# Patient Record
Sex: Male | Born: 1955
Health system: Southern US, Community
[De-identification: ages and names within clinical notes are randomized; demographics above are authoritative.]

## PROBLEM LIST (undated history)

## (undated) DIAGNOSIS — I10 Essential (primary) hypertension: Secondary | ICD-10-CM

## (undated) DIAGNOSIS — E7800 Pure hypercholesterolemia, unspecified: Secondary | ICD-10-CM

## (undated) DIAGNOSIS — C801 Malignant (primary) neoplasm, unspecified: Secondary | ICD-10-CM

## (undated) DIAGNOSIS — G4730 Sleep apnea, unspecified: Secondary | ICD-10-CM

## (undated) HISTORY — PX: HERNIA REPAIR: SHX51

---

## 2002-11-25 ENCOUNTER — Ambulatory Visit (HOSPITAL_COMMUNITY): Admission: RE | Admit: 2002-11-25 | Discharge: 2002-11-25 | Payer: Self-pay | Admitting: General Surgery

## 2003-12-06 HISTORY — PX: APPENDECTOMY: SHX54

## 2005-05-11 ENCOUNTER — Inpatient Hospital Stay (HOSPITAL_COMMUNITY): Admission: EM | Admit: 2005-05-11 | Discharge: 2005-05-17 | Payer: Self-pay | Admitting: Emergency Medicine

## 2006-03-05 ENCOUNTER — Emergency Department (HOSPITAL_COMMUNITY): Admission: EM | Admit: 2006-03-05 | Discharge: 2006-03-05 | Payer: Self-pay | Admitting: Emergency Medicine

## 2006-04-04 ENCOUNTER — Emergency Department (HOSPITAL_COMMUNITY): Admission: EM | Admit: 2006-04-04 | Discharge: 2006-04-04 | Payer: Self-pay | Admitting: Emergency Medicine

## 2006-04-12 ENCOUNTER — Ambulatory Visit (HOSPITAL_COMMUNITY): Admission: RE | Admit: 2006-04-12 | Discharge: 2006-04-12 | Payer: Self-pay | Admitting: Family Medicine

## 2006-07-05 HISTORY — PX: CARDIAC CATHETERIZATION: SHX172

## 2006-07-30 ENCOUNTER — Inpatient Hospital Stay (HOSPITAL_COMMUNITY): Admission: AD | Admit: 2006-07-30 | Discharge: 2006-08-01 | Payer: Self-pay | Admitting: Internal Medicine

## 2006-07-30 ENCOUNTER — Ambulatory Visit: Payer: Self-pay | Admitting: Internal Medicine

## 2006-07-30 ENCOUNTER — Encounter: Payer: Self-pay | Admitting: Emergency Medicine

## 2006-08-31 ENCOUNTER — Ambulatory Visit: Payer: Self-pay

## 2006-08-31 ENCOUNTER — Ambulatory Visit: Payer: Self-pay | Admitting: Cardiology

## 2006-09-05 ENCOUNTER — Ambulatory Visit (HOSPITAL_COMMUNITY): Admission: RE | Admit: 2006-09-05 | Discharge: 2006-09-05 | Payer: Self-pay | Admitting: Internal Medicine

## 2006-09-27 ENCOUNTER — Ambulatory Visit: Payer: Self-pay | Admitting: Internal Medicine

## 2007-03-11 ENCOUNTER — Emergency Department (HOSPITAL_COMMUNITY): Admission: EM | Admit: 2007-03-11 | Discharge: 2007-03-11 | Payer: Self-pay | Admitting: Emergency Medicine

## 2007-04-29 ENCOUNTER — Emergency Department (HOSPITAL_COMMUNITY): Admission: EM | Admit: 2007-04-29 | Discharge: 2007-04-29 | Payer: Self-pay | Admitting: Emergency Medicine

## 2007-12-26 ENCOUNTER — Emergency Department (HOSPITAL_COMMUNITY): Admission: EM | Admit: 2007-12-26 | Discharge: 2007-12-26 | Payer: Self-pay | Admitting: Emergency Medicine

## 2007-12-29 ENCOUNTER — Emergency Department (HOSPITAL_COMMUNITY): Admission: EM | Admit: 2007-12-29 | Discharge: 2007-12-29 | Payer: Self-pay | Admitting: Emergency Medicine

## 2008-04-12 ENCOUNTER — Emergency Department (HOSPITAL_COMMUNITY): Admission: EM | Admit: 2008-04-12 | Discharge: 2008-04-12 | Payer: Self-pay | Admitting: Emergency Medicine

## 2010-02-05 ENCOUNTER — Ambulatory Visit (HOSPITAL_COMMUNITY): Admission: RE | Admit: 2010-02-05 | Discharge: 2010-02-05 | Payer: Self-pay | Admitting: Family Medicine

## 2011-04-22 NOTE — Op Note (Signed)
   NAME:  Corey Carson, Corey Carson                         ACCOUNT NO.:  [id number]   MEDICAL RECORD NO.:  [id number]                   PATIENT TYPE:  AMB   LOCATION:  DAY                                  FACILITY:  [facility]   PHYSICIAN:  Dalia Heading, M.D.               DATE OF BIRTH:  09-30-1956   DATE OF PROCEDURE:  11/25/2002  DATE OF DISCHARGE:                                 OPERATIVE REPORT   PREOPERATIVE DIAGNOSES:  Umbilical hernia.   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   PROCEDURE:  Umbilical herniorrhaphy, ventral herniorrhaphy.   SURGEON:  Dalia Heading, M.D.   ANESTHESIA:  General.   INDICATIONS FOR PROCEDURE:  The patient is a 55 year old black male who is  referred for evaluation and treatment of an umbilical hernia. The risks and  benefits of the procedure including bleeding, infection, and recurrence of  the hernia were fully explained to the patient, who gave informed consent.   DESCRIPTION OF PROCEDURE:  The patient was placed in the supine position.  After general anesthesia was administered, the abdomen was prepped and  draped using the usual sterile technique with Betadine. Surgical site  confirmation was performed.   An infraumbilical incision was made down to the fascia. The umbilicus was  freed away from the underlying umbilical hernia sac. It was also noted the  patient had a epiplocele just superior to the umbilical hernia. There was  preperitoneal fat emanating from the epiplocele. This adipose tissue was  excised and the epiplocele closed using #0 Surgidek interrupted sutures. The  hernia sac was excised from the umbilicus and the hernia repaired using #0  Surgidek interrupted sutures. The base of the umbilicus was secured to the  fascia using a 2-0 Vicryl interrupted suture. The subcutaneous layer was  reapproximated using a 3-0 Vicryl interrupted suture. The skin was closed  using staples. 0.5%  Sensorcaine was instilled into the surrounding wound.  The wound was closed with staples.   All tape and needle counts were correct at the end of the procedure. The  patient was awakened and transferred to PACU in stable condition.   COMPLICATIONS:  None.    SPECIMENS:  Umbilical hernia sac.   ESTIMATED BLOOD LOSS:  Minimal.                                               Dalia Heading, M.D.    MAJ/MEDQ  D:  11/25/2002  T:  11/25/2002  Job:  [id number]   cc:   Corrie Mckusick, M.D.  [address]ure[address] Josephs. A  [place]  [place] [address]  Fax: [phone]

## 2011-04-22 NOTE — Cardiovascular Report (Signed)
NAMEBENNY, Corey Carson               ACCOUNT NO.:  [id number]   MEDICAL RECORD NO.:  [id number]          PATIENT TYPE:  INP   LOCATION:  2002                         FACILITY:  [facility]   PHYSICIAN:  Noralyn Pick. Nishan, MD,FACCDATE OF BIRTH:  July 31, 1956   DATE OF PROCEDURE:  DATE OF DISCHARGE:                              CARDIAC CATHETERIZATION   CORONARY ARTERIOGRAPHY:   INDICATIONS:  Chest pain, PVC.   Catheterization was done from right femoral artery, using 6 French  catheters.  At the end of the case, right iliac angiography was performed.  We were in good position to Angio-Seal, which we deployed with good  hemostasis.  The patient had a mild vagal reaction after closure device,  secondary to the pain.   This was just treated with a little bit of fluid and no atropine was  necessary.   Left main coronary artery was normal.   Left anterior descending artery was normal.   First and second diagonal branch was normal.   Circumflex coronary artery was nondominant and normal.   The right coronary artery was somewhat difficult to cannulate, had a high  anterior take-off near the right and left coronary commissure.  We were able  to inject it using an AL1 catheter.  If the patient has future  catheterization, I suspect an AL2 would selectively cannulate it.   The right coronary artery was dominant and normal.   RIGHT VENTRICULOGRAPHY:  Right ventriculography showed mild global  hypokinesis with an EF of 50-55%.  There were no focal regional wall motion  abnormalities.  There was no MR.   Aortic pressure was 149/94, LV pressure was 150/12.   IMPRESSION:  The patient does not have significant coronary disease.  His  chest pain would appear to be noncardiac in etiology.  He does appear to  have hypertension and PVC.   We will start him on lisinopril 10 mg a day.   So long as his groin heals well, I suspect he will be able to go home  tomorrow.     ______________________________  Noralyn Pick Eden Emms, MD,FACC     PCN/MEDQ  D:  07/31/2006  T:  08/01/2006  Job:  [id number]

## 2011-04-22 NOTE — Assessment & Plan Note (Signed)
[facility]                           ELECTROPHYSIOLOGY OFFICE NOTE   ZEVIN, NEVARES                      MRN:          [id number]  DATE:09/27/2006                            DOB:          1956-11-26    Corey Carson returns today for follow-up.  He is a very pleasant middle-aged  man with a history of hypertension who was admitted to the hospital with  chest pain and syncope.  Although he had a good story for coronary disease,  he was subsequently found to have normal LV function and no obstructive  coronary disease on catheterization.  He was discharged home and returns  today for follow-up.  The patient has been on lisinopril for hypertension  and Prilosec OTC for gastroesophageal reflux disease.  He has had no syncope  and overall has done well since discharge from the hospital.  He does note  that he does not particularly like taking his blood pressure medications but  realizes that he must do so.   PHYSICAL EXAMINATION:  GENERAL APPEARANCE:  He is a pleasant middle-aged man  in no acute distress.  VITAL SIGNS:  Blood pressure 140/90, pulse 70 and regular, respirations 18,  weight 244 pounds.  NECK:  No jugular venous distension, no thyromegaly.  Trachea midline.  Carotids 2+ and symmetric.  LUNGS:  Clear to auscultation bilaterally.  No wheezing, rhonchi or rales.  CARDIOVASCULAR:  Regular rate and rhythm with normal S1 and S2.  There was a  soft S4 gallop present.  EXTREMITIES:  No clubbing, cyanosis, or edema.   IMPRESSION:  1. Atypical chest pain.  2. Syncope.  3. Right bundle branch block.   DISCUSSION:  Overall, Mr. Dehner is stable.  I have cautioned him of the  importance of taking his antihypertensive medications.  In addition, if he  passes out again, we would consider 30-day monitor or implantable loop  recorder.  We will see him back in one year, sooner should he have recurrent  syncope.     ______________________________  Doylene Canning. Ladona Ridgel, MD    GWT/MedQ  DD:  09/27/2006  DT:  09/28/2006  Job #:  [id number]   cc:   Madelin Rear. Sherwood Gambler, MD

## 2011-04-22 NOTE — Consult Note (Signed)
NAMESUKHRAJ, ESQUIVIAS NO.:  [id number]   MEDICAL RECORD NO.:  [id number]          PATIENT TYPE:  EMS   LOCATION:  ED                            FACILITY:  [facility]   PHYSICIAN:  Barbaraann Barthel, M.D. DATE OF BIRTH:  1956/03/18   DATE OF CONSULTATION:  DATE OF DISCHARGE:                                   CONSULTATION   Surgery was asked to see this 55 year old black male for appendicitis.   CHIEF COMPLAINT:  Lower abdominal pain and anorexia.   HISTORY OF PRESENT ILLNESS:  The patient states that he has had vague  abdominal pain for about possibly 4 weeks.  He attributed this to a stomach  virus and did not self-medicate himself with anything particularly, and when  the pain worsened today and he was doubled over with discomfort, he came to  the emergency room.  A CT scan was performed and revealed appendicitis  without perforation.  Surgery was consulted and responded immediately.   PHYSICAL EXAMINATION:  GENERAL: A 55 year old black male, uncomfortable, but  in no acute distress.  He is approximately 5 feet 10 inches and weighs 250  pounds.  His temperature is 98.5, pulse rate 66, respirations 18 per minute,  blood pressure 118/64.  HEENT: Head is normocephalic.  Eyes: Extraocular movements intact.  Pupils  are round and react to light and accommodation.  There is no conjunctival  pallor or scleral injection.  Sclerae is of normal tincture.  Nose and oral  mucosa are moist.  NECK: Supple and cylindrical without jugular venous distention, thyromegaly,  tracheal deviation, or cervical adenopathy.  No bruits are auscultated.  CHEST: Clear, both anterior and posterior, to auscultation.  HEART: Regular rhythm.  ABDOMEN: There is guarding in the right lower quadrant.  No obvious rebound.  Bowel sounds are present.  The patient has had a previous herniorrhaphy;  however, I do not see any scars from this and he does not recall which side  he was operated on.  RECTAL:  Prostate is smooth.  Stool is guaiac negative.  EXTREMITIES: Within normal limits.  Pulses are symmetrical.  No cyanosis,  clubbing, or varicosities.   REVIEW OF SYSTEMS:  GI SYSTEM: No past history of hepatitis.  The patient  has had some mild nausea and anorexia beginning last night and vague  abdominal pain for 3-4 weeks.  He reports no change in his bowel habits.  No  bright red rectal bleeding, black tarry stools, or history of inflammatory  bowel disease or unexplained weight loss.  GU SYSTEM: No history of  nephrolithiasis or dysuria.  ENDOCRINE: No history of diabetes or thyroid  disease.  CARDIORESPIRATORY SYSTEM: The patient is a nondrinker, nonsmoker.  The patient does have a history of asthma for which he takes an inhaler on a  p.r.n. basis.   SOCIOECONOMIC HISTORY:  The patient works for [employer].   MEDICATIONS:  The patient is to take no medications other than an inhaler on  a p.r.n. basis.   ALLERGIES:  He is allergic to SULFA.   PAST SURGICAL HISTORY:  Herniorrhaphy, unknown which  side, and I cannot tell  clinically, and his mother states he has also had a tonsillectomy.   LABORATORY DATA:  His CT scan shows acute appendicitis without abscess  formation; however, I note considerable periappendiceal inflammation and  there is some small amount of fluid in the pelvis, and I suspect that he  might have an abscess clinically.  His white count is 13.4 with an H&H of  13.1 and 38.4 with 73 neutrophils noted and 330,000 platelets.  His  electrolytes are all grossly within normal limits.  His BUN is 10 and his  creatinine is 0.8.  Liver function studies are within normal limits.   IMPRESSION:  1.  Acute appendicitis, I suspect abscess formation clinically.  2.  History of asthma for which he takes an inhaler.   PLAN:  We will plan for an appendectomy as soon as possible.  This will  likely be an open appendectomy as I suspect that this will require that  there will  be considerable inflammation there.  We discussed complications  not limited to, but including bleeding, infection, and appendiceal stump  leaking, and the possibility of the need for a drain and packing wound open.  Informed consent was obtained.  I discussed this in detail with both his  mother and father and consent was obtained and witnessed.       WB/MEDQ  D:  05/11/2005  T:  05/11/2005  Job:  [id number]   cc:   Dr. __________  Emergency Room

## 2011-04-22 NOTE — Op Note (Signed)
NAMEYERIK, ZERINGUE NO.:  [id number]   MEDICAL RECORD NO.:  [id number]          PATIENT TYPE:  EMS   LOCATION:  ED                            FACILITY:  [facility]   PHYSICIAN:  Barbaraann Barthel, M.D. DATE OF BIRTH:  1956-04-18   DATE OF PROCEDURE:  05/11/2005  DATE OF DISCHARGE:                                 OPERATIVE REPORT   SURGEON:  Dr. Malvin Johns.   PREOPERATIVE DIAGNOSIS:  Acute appendicitis with impression of possible  abscess formation.   POSTOPERATIVE FINDINGS:  Acute retrocecal appendix with perforation of the  tip of the appendix.   PROCEDURE:  Open appendectomy.   SPECIMEN:  Appendix.   NOTE:  This is a 55 year old black male who was suffering for three to four  weeks with vague right lower quadrant pain which worsened today he came to  the emergency room and a CT scan showed acute appendicitis without obvious  perforation and with considerable inflammation in the right lower quadrant.  The patient had rebound tenderness and an elevated white count. We took him  to surgery after discussing complications not limited to but including  bleeding, infection and perforation of appendiceal stump. Informed consent  was obtained.   GROSS OPERATIVE FINDINGS:  The patient had a very retrocecal appendix with  considerable inflammation where the appendix had been in reposing upon the  mesentery of the terminal ileum. We had to free up the right colon in order  to reach the appendix.  At the tip of the appendix there was there was a  small fecalith perforation and no abscess.   TECHNIQUE:  The patient was placed in the supine position and after the  adequate administration of general anesthesia via endotracheal intubation,  his entire was prepped with Betadine solution and draped in the usual  manner. A Foley catheter was aseptically inserted.  A right lower quadrant  incision was carried out over McBurney's point through skin, subcutaneous  tissue through a  portion of the rectus muscle with the posterior sheath and  the peritoneum was grasped between two clamps and the abdomen was entered  and explored with the above findings. We had to free up the right colon in  order to reach the appendix. There was considerable adhesions around this  area. No obvious abscess there.  We did find that the perforation of the tip  as mentioned above. We removed the appendix by using the GIA stapler and  ligating mesial appendix with 2-0 Vicryl and then after removing this from  the stump, we were able to go from proximal to distal and with careful  freeing up, we removed the appendix without any perforation of the bowel. We  then changed gloves, copiously irrigated the abdominal cavity, closed the  peritoneum with a running O Polysorb suture and the fascia with figure-of-  eight 0 Polysorb suture. The subcu was irrigated and the skin was loosely  approximated with stapling device with saline-soaked gauze placed in between  the staple line with sterile dressing applied. A Jackson-Pratt drain was  placed through a separate stab wound incision in  this area. This was sutured place with 3-0 nylon. Prior to closure all  sponge, needle and instrument counts were found to be correct. Estimated  blood loss was approximately 300 cc.  The patient received 2400 cc  crystalloids intraoperatively. There were no complications.       WB/MEDQ  D:  05/11/2005  T:  05/11/2005  Job:  [id number]   cc:   Sheppard Penton. Stacie Acres, M.D.  Fax: [phone]

## 2011-04-22 NOTE — Discharge Summary (Signed)
Corey Carson, Corey Carson               ACCOUNT NO.:  [id number]   MEDICAL RECORD NO.:  [id number]          PATIENT TYPE:  INP   LOCATION:  2002                         FACILITY:  [facility]   PHYSICIAN:  Doylene Canning. Ladona Ridgel, MD    DATE OF BIRTH:  02-19-1956   DATE OF ADMISSION:  07/30/2006  DATE OF DISCHARGE:  08/01/2006                                 DISCHARGE SUMMARY   ALLERGIES:  He has allergy to SULFA.   PRIMARY DIAGNOSES:  1. Admitted with substernal chest pain.      a.     Troponin 1 studies were 0.03 and 0.03 then 0.03.      b.     Electrocardiogram shows sinus rhythm/right bundle branch block.      c.     Catheterization July 31, 2006 normal coronary anatomy/ejection       fraction 50-55%, mild global hypokinesis.  2. Hypertension.      a.     Start ACE I inhibitor for hypertension.  3. Syncope on July 30, 2006 probably vasovagal.      a.     Monitor if syncope recurs.   SECONDARY DIAGNOSES:  1. Asthma.  2. Status post appendectomy June 2006 secondary to a ruptured appendix.  3. Umbilical/ventral herniorrhaphy.  4. Probable gastroesophageal reflux disease.   FAMILY HISTORY:  Coronary artery disease.   PROCEDURE:  July 31, 2006, left heart catheterization study showed that  the left main, the LAD and the left circumflex and the right coronary artery  all exhibit normal coronary anatomy.  Ejection fraction 50-55% with mild  global hypokinesis by Dr. Charlton Haws.  Hypertension was observed during  the procedure and the patient was recommended to start on antihypertensive  medication this admission.   BRIEF HISTORY:  Mr. Quadros is a 55 year old male.  He has no prior cardiac  history.  He was transferred from [facility] for further evaluation  of chest pain.   He presented to the emergency room with chest pain.  This has been  refractory to IV nitroglycerin, but has responded to morphine.  Initial  cardiac enzymes were negative.  Electrocardiogram shows normal  sinus rhythm,  right bundle branch block.  He has inverted T-waves in the precordial leads.   The patient also reports having a syncopal episode the morning of July 30, 2006.  This occurred while he was standing in a pew at a church.  He had no  prodrome.  The episode was witnessed by his wife and other members.  The  estimated his of loss of consciousness lasted about 10 minutes.  Following  resuscitation, he was transferred directly to [facility] Emergency  Room by his wife but reportedly had several more episodes of passing out.   The patient's chest pain began sometime on the evening of July 29, 2006  and has been constant.  There is a reproducible quality to it with palpation  of the sternum; however, he denies any pleuritic component.  The patient  also reports that he has had exertional chest discomfort for several months,  particularly  during heavy lifting or strenuous walking.  He also notes some  associated dyspnea, diaphoresis, nausea and arm weakness with the discomfort  all of which resolves with rest.  The plan is to admit the patient here at  [facility] to telemetry.  He will be continued on aspirin and Lovenox, but  beta blocker will be deferred given the history of asthma and current sinus  bradycardia.  The D-dimer will be checked to exclude pulmonary embolus and  if positive we will follow up with a CT of the chest.  However, if the D-  dimer is negative, plan will be proceeding with coronary angiography to  exclude coronary artery disease.   HOSPITAL COURSE:  The patient admitted to [facility] from [facility] with a diagnosis of chest pain and syncope in the setting of  chest pain.  He was maintained on Lovenox, metoprolol, lisinopril and  aspirin as well as Protonix.  The patient's chest pain had dissipated by  hospital day #2.  He underwent left heart catheterization on July 31, 2006.  The study showed normal coronary anatomy with  mildly depressed and  mild global hypokinesis.  His ejection fraction was 50-55%.  The patient has  had no further chest pain after the catheterization and will discharge with  some medical therapy aimed at the hypertension and also aimed at the  possibility of recurrent reflux.  He is asked not to drive for the next two  days.  He is asked not to lift anything heavier than 10 pounds for the next  two weeks.  He has been given a supplemental sheet called instructions after  cardiac catheterization.   DISCHARGE MEDICATIONS:  1. His medications at discharge are enteric-coated aspirin 81 mg daily      (that is a new).  2. Albuterol inhaler as needed.  3. Skelaxin 100 mg three times daily as needed.  4. Relafen 750 mg twice daily.  5. Lisinopril 10 mg daily (this is new for blood pressure control)  6. Prilosec over-the-counter to take once or twice daily for reflux      symptoms.   He is asked to call Dr. Sherwood Gambler to make an appointment in two to three weeks  after discharge and he will follow-up with the staff/physician assistant at  [facility] at [address] Tuesday, August 22, 2006  at 9:45 for postcatheterization check.   LABORATORY STUDIES THIS ADMISSION:  TSH was 1.581.  Hemoglobin A1c was 6.4  which is mildly high.  The D-dimer was less than 0.22.  Cardiac markers were  less than 0.05, then less than 0.05, then 0.03, then 0.03, then 0.03.  Complete blood count this admission:  Hemoglobin 13.1, hematocrit 39, white  cells 7.8, platelets 352,000.  Serum electrolytes this admission:  Sodium 136, potassium 3.5, chloride 106,  bicarbonate 25, BUN 13, creatinine 0.8, and glucose 113.  Alkaline  phosphatase is 59, SGOT 17, SGPT is 16.  The lipase was 36.  This is a 30-  minute discharge.     ______________________________  Maple Mirza, PA    ______________________________  Doylene Canning. Ladona Ridgel, MD    GM/MEDQ  D:  08/01/2006  T:  08/01/2006  Job:   [id number]   cc:   Madelin Rear. Sherwood Gambler, MD  [facility]

## 2011-04-22 NOTE — H&P (Signed)
   NAME:  Corey Carson, Corey Carson                           ACCOUNT NO.:  [id number]   MEDICAL RECORD NO.:  [id number]                  PATIENT TYPE:   LOCATION:                                       FACILITY:  [facility]   PHYSICIAN:  Dalia Heading, M.D.               DATE OF BIRTH:  1956-10-31   DATE OF ADMISSION:  DATE OF DISCHARGE:                                HISTORY & PHYSICAL   CHIEF COMPLAINT:  Umbilical hernia.   HISTORY OF PRESENT ILLNESS:  The patient is a 55 year old black male who is  referred for evaluation and treatment of an umbilical hernia.  It has been  present for some time, but recently has increased in size.  No nausea or  vomiting had been noted.   PAST MEDICAL HISTORY:  Asthma.   PAST SURGICAL HISTORY:  Inguinal herniorrhaphy in the remote past.   CURRENT MEDICATIONS:  Albuterol and Advair.   ALLERGIES:  SULFA.   REVIEW OF SYSTEMS:  The patient denies drinking or smoking.   PHYSICAL EXAMINATION:  GENERAL:  On physical examination, the patient is a  well-developed well-nourished black male in no acute distress.  VITAL SIGNS:  He is afebrile and vital signs are stable.  CHEST:  Lungs clear to auscultation with equal breath sounds bilaterally.  CARDIAC:  Heart examination reveals a regular rate and rhythm without S3,  S4, or murmurs.  ABDOMEN:  The abdomen is soft, nontender, nondistended.  No  hepatosplenomegaly, masses, or rigidity are noted.  A medium-sized umbilical  hernia is present.   IMPRESSION:  Umbilical hernia.    PLAN:  The patient was scheduled for an umbilical herniorrhaphy on November 26, 2002.  The risks and benefits of the procedure including bleeding,  infection, and the possibility of recurrence of the hernia were fully  explained to the patient, who gave informed consent.                                               Dalia Heading, M.D.    MAJ/MEDQ  D:  11/14/2002  T:  11/14/2002  Job:  [id number]   cc:   [facility]  [address], [place] [place]  [place], [place] [address]   Corrie Mckusick, M.D.  [address]. [address]  [place]  [place] [address]  Fax: [phone]

## 2011-04-22 NOTE — Assessment & Plan Note (Signed)
[facility]                              CARDIOLOGY OFFICE NOTE   [name], [name]                      MRN:          [id number]  DATE:08/31/2006                            DOB:          January 10, 1956    PRIMARY CARE PHYSICIAN:  Dr. Elfredia Nevins.   HISTORY OF PRESENT ILLNESS:  Corey Carson is a 55 year old male patient with  a history of hypertension who was recently admitted to [facility]  for substernal chest pain.  He underwent cardiac catheterization that showed  no coronary artery disease and a good LV function with an EF of 50-55%.  He  had had an episode of syncope prior to coming to the hospital.  This was  felt to be vasovagal and the recommendations were to monitor this only and  to place him on an event monitor should it recur.  The patient returns to  the office today for post hospitalization followup.  He continues to have  some chest pain off and on.  This is clearly musculoskeletal.  He only feels  it when he lifts heavy objects or moves a certain way, or moves his arm.  He  denies any significant shortness of breath.  Denies any orthopnea or  paroxysmal nocturnal dyspnea.  He does, however, note occasional dizziness.  He does describe an episode that occurred about 2 weeks ago where he  developed right-sided facial numbness and tingling, as well as right arm and  leg numbness and tingling.  This lasted a day or 2 per his report.  It  resolved on its own.  He has not had any since then.  He denies any weakness  in his extremities at that time.  He denies any difficulty with speech.  He  denies any changes in his vision at that time.  He has never had a history  of stroke.   CURRENT MEDICATIONS:  1. Lisinopril 10 mg daily.  2. Prilosec OTC.  3. __________ vitamin.   ALLERGIES:  Sulfa and aspirin.   REVIEW OF SYSTEMS:  Please see HPI.  Denies any fevers, chills, cough,  melena, hematochezia, hematuria, dysuria.  The  rest of the review of systems  are negative.   PHYSICAL EXAM:  He is a well-nourished, well-developed male in no acute  distress.  Blood pressure 158/92, pulse 57, weight 244 pounds.  HEAD:  Normocephalic, atraumatic.  EYES:  PERRLA.  EOMI.  Sclerae are clear.  Fundi are within normal limits  bilaterally.  Carotids are without bruits bilaterally.  LYMPH:  Without lymphadenopathy.  No JVD noted.  ENDOCRINE:  Without thyromegaly.  CARDIAC:  Normal S1, S2.  Regular rate and rhythm without murmurs.  LUNGS:  Clear to auscultation bilaterally without wheeze, rales, or rhonchi.  ABDOMEN:  Nontender with normoactive bowel sounds.  No organomegaly.  Right  groin without hematomas or bruits.  EXTREMITIES:  Without edema.  Calves are soft and nontender.  SKIN:  Warm and dry.  NEUROLOGIC:  He is alert and oriented x3.  Cranial nerves 2-12 are grossly  intact.  Strength is 5/5 and  equal in all extremities and axial groups.  Fine motor skills are without deficit.  Finger to nose, rapidly alternating  movements, heel to shin all without deficit.  No clonus noted.  Romberg is  negative.  Sensation is intact.   Electrocardiogram reveals sinus bradycardia with a heart rate of 57.  Normal  axis.  Right bundle branch block with T wave inversions in 2, 3, aVF, V1  through V6.  No significant change since previous tracings from the  hospital.   IMPRESSION:  1. Musculoskeletal chest pain.  No further workup warranted.  2. Normal coronary arteries by recent catheterization.  3. Good left ventricular function.  4. History of recent syncope, likely vasovagal.  5. Occasional dizziness.  6. Prolonged episode of right-sided paresthesias.  7. Uncontrolled hypertension.  8. Sinus bradycardia.  9. Right bundle branch block.  10.History of asthma.   PLAN:  The patient presents to the office today for post catheterization  followup.  He had normal coronary arteries by catheterization.  he has  continued to  have some chest pain since then.  It is clearly  musculoskeletal.  He does, however, describe the symptoms of paresthesias on  his right side a couple of weeks ago that is somewhat concerning.  His  neurological exam is completely normal by me.  Some of his symptoms do not  exactly sound consistent with a TIA, especially with how prolonged it was.  He does have uncontrolled hypertension.  I think that I am going to set him  up for an MRI of the brain with diffusion-weighted images, as well as an MRA  of the head and neck to work this up.  Will also refer him to neurology for  further evaluation.  He will get a BMET today and we will increase his  lisinopril to 20 mg a day.  Since he did recently get admitted for syncope,  has had some dizziness, as well as some sinus bradycardia and right bundle  branch block, we will go ahead and put him on an event monitor.  I have  asked him to  followup with his primary care physician in the next week or 2 to follow up  on his blood pressure.  I would like him to come back and see Dr. Ladona Ridgel in  followup in the next 4 weeks.                                  Tereso Newcomer, PA-C                           Arturo Morton. Riley Kill, MD, [facility]   SW/MedQ  DD:  08/31/2006  DT:  08/31/2006  Job #:  [id number]   cc:   Doylene Canning. Ladona Ridgel, MD  Madelin Rear. Sherwood Gambler, MD  [facility]

## 2011-04-22 NOTE — Discharge Summary (Signed)
Corey Carson, Corey Carson               ACCOUNT NO.:  [id number]   MEDICAL RECORD NO.:  [id number]          PATIENT TYPE:  INP   LOCATION:  A302                          FACILITY:  [facility]   PHYSICIAN:  Barbaraann Barthel, M.D. DATE OF BIRTH:  1956-04-07   DATE OF ADMISSION:  05/11/2005  DATE OF DISCHARGE:  06/13/2006LH                                 DISCHARGE SUMMARY   DIAGNOSIS:  Perforated appendicitis.   PROCEDURE:  On May 11, 2005, open appendectomy.   SECONDARY DIAGNOSIS:  Asthma.   NOTE:  This is a 55 year old black male who was admitted with approximately  3-week history of abdominal pain. He was seen in the emergency room. CT scan  showed that he had appendicitis. There was considerable periappendiceal  inflammation and some fluid present. I suspected clinically possibly an  abscess at that time with the possibility of perforation as well. He was  begun on antibiotics parenterally and then he was hydrated and taken to  surgery on May 11, 2005 at which time appendectomy confirmed a perforated  appendix. There was considerable inflammation around the mesentery of the  terminal ileum where the appendix had perforated and walled off. This area  was then debrided and the appendix stump was removed. The abdomen was  copiously irrigated and drained. He remained on parenteral antibiotics  throughout his stay. His drain was removed on the fifth postoperative day,  and he was discharged on the seventh postoperative day. At the time of  discharge, his wound was clean without any signs of infection. His wound had  been packed open, and the packing was removed on the second postoperative  day, and the wound was irrigated and remained clean during his hospital  stay. His diet and activity was advanced as tolerated. He had somewhat of a  postoperative ileus as can be expected from the perforation and inflammation  present; however, his diet and activity was advanced as tolerated. At the  time of  his discharge on May 17, 2005, he was tolerating p.o. well, voiding  without dysuria and had no leg pain, and he had no breathing problems during  his stay. We will follow up with him perioperatively.   LABORATORY DATA:  He was admitted with a white count of 13.4 and with H&H of  13.1 and 38.4 with 73% neutrophils noted. On May 16, 2005, his white count  was down at 10.5 with H&H of 11.8 and 34.5. His metabolic 7 on May 16, 2005  was grossly within normal limits. Blood sugar mildly elevated at 116. We  will follow this perioperatively. His his urinalysis was grossly within  normal limits. The CT scan as mentioned above showed signs of acute  appendicitis. His pathology report showed acute appendicitis with gross  evidence of rupture and serositis.   DISCHARGE INSTRUCTIONS:  He is excused from work. He is discharged on a full  liquid and soft diet. He is told to increase his activity as tolerated. He  permitted to shower. He is told to clean his wound with alcohol three times  a day and dress it with a  4x4, particularly over the old drain site. He is  told to do no heavy lifting, no vigorous sexual activity and no driving  until we see him perioperatively. He is told to resume his breathing  medications as needed. He is told to take no aspirin products and continue  Cipro 500 mg p.o. q.12h., the next dose being at 8 p.m.  May 17, 2005 for an additional 5 days. Darvocet-N 100 was given to take  every 4 hours as needed for pain. We will follow him up perioperatively. He  is told to contact us or go to the emergency room should there be any acute  problems.       WB/MEDQ  D:  05/17/2005  T:  05/17/2005  Job:  [id number]

## 2011-04-22 NOTE — H&P (Signed)
Corey Carson, Corey Carson               ACCOUNT NO.:  [id number]   MEDICAL RECORD NO.:  [id number]          PATIENT TYPE:  INP   LOCATION:  2002                         FACILITY:  [facility]   PHYSICIAN:  Doylene Canning. Ladona Ridgel, MD    DATE OF BIRTH:  11/19/1956   DATE OF ADMISSION:  07/30/2006  DATE OF DISCHARGE:                                HISTORY & PHYSICAL   REASON FOR ADMISSION:  Corey Carson is a 55 year old male with no prior  cardiac history now transferred from [facility] ER for further evaluation of  chest pain.  He presented to the emergency room with complaints of chest  pain which has been refractory to treatment with IV nitroglycerin, but has  responded to morphine.  Initial cardiac enzymes have been negative.  Electrocardiogram reveals normal sinus rhythm with a right bundle branch  block and inverted T-waves in the precordial leads.   The patient also reports having had a syncopal episode on the morning of  admission.  This occurred while he was standing in the pew at church.  He  reports no prodromal symptoms and the episode was witnessed by his wife and  other members.  They estimate that he was out for approximately 10 minutes.  Following resuscitation he was transferred directly to the ER by his wife,  but reportedly had several more episodes of passing out.   The patient states that his chest pain began sometime yesterday evening and  that it has been constant.  There is a reproducible quality to it with  palpation of the sternum; however, he denies any pleuritic component.   The patient also, however, reports that he has had exertional chest  discomfort for several months, particularly during heavy lifting or  strenuous walking.  He also notes some associated dyspnea, diaphoresis,  nausea, and arm weakness with the discomfort, all of which resolves with  rest.   ALLERGIES:  SULFA.   MEDICATIONS:  1. Albuterol MDI p.r.n.  2. Skelaxin 800 t.i.d. p.r.n.  3. Relafen 750  b.i.d.   PAST MEDICAL HISTORY:  1. Asthma.  2. Status post appendectomy June 2006 secondary to ruptured appendix  3. Umbilical/ventral herniorrhaphy.   SOCIAL HISTORY:  The patient lives in [place] with his wife.  They have  three children.  He works as a Education officer, community.  He has never smoked tobacco  and denies alcohol or drug use.   FAMILY HISTORY:  Father in his 69s; history of MI/CABG.  A brother died at  age of 3, presumably secondary to MI with history of pacemaker.  He has a  sister who has hypertension.   REVIEW OF SYSTEMS:  Notable for chronic two-pillow orthopnea, occasional  PND, new-onset exertional dyspnea, and occasional palpitations.  No recent  developed fever, chills, productive cough, or evidence of overt GI bleeding.  Has symptoms of reflux.  Denies any prior history of syncope.  Otherwise as  noted per HPI, all remaining systems negative.   PHYSICAL EXAMINATION:  VITAL SIGNS:  Blood pressure 140/93; pulse 57,  regular; respirations 18, temperature 97; weight 239.  GENERAL:  A  55 year old male, no apparent distress.  HEENT: Normocephalic, atraumatic.  NECK:  Palpable bilateral carotid pulses without bruits; no JVD.  LUNGS:  Diminished breath sounds in bases with faint late expiratory  wheezes.  HEART:  Regular rate and rhythm (S1, S2).  No significant murmurs.  There is  moderate tenderness with palpation of the left sternum.  ABDOMEN:  Soft, nontender, with intact bowel sounds.  EXTREMITIES:  Palpable femoral and distal pulses with no significant edema.  No other focal deficit.   Chest x-ray ([facility]):  Mild cardiomyopathy; no acute disease.  Admission  electrocardiogram:  Sinus bradycardia at 57 bpm with right bundle-branch  block and symmetric T-wave inversion in the precordial leads.   LABORATORY DATA:  Cardiac enzymes (POC):  MB 4.3 (x2); troponin I less than  0.05 (x2).  Hemoglobin 13, hematocrit 39, wbc's 7.8, platelet 350.  Sodium  136, potassium 3.5,  BUN 13, creatinine 0.8, and glucose 113.  Liver enzymes  normal.   IMPRESSION:  1. Chest pain.      a.     Typical/atypical features.      b.     Associated syncope.  2. Right bundle-branch block.  3. Asthma.  4. Probable gastroesophageal reflux disease.  5. Family history coronary artery disease.   PLAN:  Our recommendation is to admit to telemetry for close monitoring and  rule out a myocardial infarction.  The patient will be continued on aspirin  and Lovenox but we will defer beta-blocker given his history of asthma and  current sinusitis bradycardia.  A D-dimer will be checked to exclude  pulmonary embolus and, if positive, we will follow up with a CT scan of the  chest.  If, however, this is negative, then plan is to proceed with a  coronary angiogram in the morning to exclude significant coronary artery  disease.     ______________________________  Rozell Searing, PA-C    ______________________________  Doylene Canning. Ladona Ridgel, MD    GS/MEDQ  D:  07/31/2006  T:  07/31/2006  Job:  [id number]   cc:   Corrie Mckusick, M.D.

## 2011-08-02 ENCOUNTER — Observation Stay (HOSPITAL_COMMUNITY)
Admission: EM | Admit: 2011-08-02 | Discharge: 2011-08-03 | Disposition: A | Discharge status: Home / Self Care | Payer: 59 | Attending: Internal Medicine | Admitting: Internal Medicine

## 2011-08-02 ENCOUNTER — Encounter: Payer: Self-pay | Admitting: *Deleted

## 2011-08-02 ENCOUNTER — Emergency Department (HOSPITAL_COMMUNITY): Payer: 59

## 2011-08-02 ENCOUNTER — Other Ambulatory Visit: Payer: Self-pay

## 2011-08-02 DIAGNOSIS — R05 Cough: Secondary | ICD-10-CM | POA: Insufficient documentation

## 2011-08-02 DIAGNOSIS — I10 Essential (primary) hypertension: Secondary | ICD-10-CM | POA: Diagnosis present

## 2011-08-02 DIAGNOSIS — R0789 Other chest pain: Principal | ICD-10-CM | POA: Diagnosis present

## 2011-08-02 DIAGNOSIS — R059 Cough, unspecified: Secondary | ICD-10-CM | POA: Insufficient documentation

## 2011-08-02 DIAGNOSIS — R9431 Abnormal electrocardiogram [ECG] [EKG]: Secondary | ICD-10-CM | POA: Insufficient documentation

## 2011-08-02 DIAGNOSIS — R079 Chest pain, unspecified: Secondary | ICD-10-CM

## 2011-08-02 HISTORY — DX: Essential (primary) hypertension: I10

## 2011-08-02 LAB — CBC
MCH: 27.8 pg (ref 26.0–34.0)
MCHC: 34 g/dL (ref 30.0–36.0)
RDW: 13 % (ref 11.5–15.5)

## 2011-08-02 LAB — CARDIAC PANEL(CRET KIN+CKTOT+MB+TROPI)
CK, MB: 3.6 ng/mL (ref 0.3–4.0)
Total CK: 106 U/L (ref 7–232)
Total CK: 93 U/L (ref 7–232)
Troponin I: <0.3 ng/mL (ref <0.30)

## 2011-08-02 LAB — COMPREHENSIVE METABOLIC PANEL
Alkaline Phosphatase: 88 U/L (ref 39–117)
BUN: 16 mg/dL (ref 6–23)
CO2: 25 mEq/L (ref 19–32)
Calcium: 9.7 mg/dL (ref 8.4–10.5)
GFR calc Af Amer: >60 mL/min (ref >60)
GFR calc non Af Amer: >60 mL/min (ref >60)
Glucose, Bld: 159 mg/dL — ABNORMAL HIGH (ref 70–99)
Total Protein: 7.1 g/dL (ref 6.0–8.3)

## 2011-08-02 LAB — POCT I-STAT TROPONIN I: Troponin i, poc: 0.04 ng/mL (ref 0.00–0.08)

## 2011-08-02 MED ORDER — ONDANSETRON HCL 4 MG PO TABS: 4.0000 mg | ORAL_TABLET | Freq: Four times a day (QID) | ORAL | Status: DC | PRN

## 2011-08-02 MED ORDER — NITROGLYCERIN 0.4 MG SL SUBL
0.4000 mg | SUBLINGUAL_TABLET | Freq: Once | SUBLINGUAL | Status: AC
  Administered 2011-08-02: 0.4 mg via SUBLINGUAL
  Filled 2011-08-02: qty 25

## 2011-08-02 MED ORDER — ASPIRIN 81 MG PO CHEW
324.0000 mg | CHEWABLE_TABLET | Freq: Once | ORAL | Status: AC
  Administered 2011-08-02: 324 mg via ORAL
  Filled 2011-08-02: qty 4

## 2011-08-02 MED ORDER — GUAIFENESIN 100 MG/5ML PO SOLN
200.0000 mg | Freq: Three times a day (TID) | ORAL | Status: DC | PRN
  Administered 2011-08-03: 200 mg via ORAL
  Filled 2011-08-02: qty 10

## 2011-08-02 MED ORDER — ACETAMINOPHEN 325 MG PO TABS
650.0000 mg | ORAL_TABLET | Freq: Four times a day (QID) | ORAL | Status: DC | PRN
  Administered 2011-08-02: 650 mg via ORAL
  Filled 2011-08-02: qty 2

## 2011-08-02 MED ORDER — SODIUM CHLORIDE 0.9 % IV SOLN
Freq: Once | INTRAVENOUS | Status: AC
  Administered 2011-08-02: 09:00:00 via INTRAVENOUS

## 2011-08-02 MED ORDER — MORPHINE SULFATE 2 MG/ML IJ SOLN: 2.0000 mg | INTRAMUSCULAR | Status: DC | PRN

## 2011-08-02 MED ORDER — IPRATROPIUM BROMIDE 0.02 % IN SOLN
0.5000 mg | Freq: Once | RESPIRATORY_TRACT | Status: AC
  Administered 2011-08-02: 0.5 mg via RESPIRATORY_TRACT
  Filled 2011-08-02: qty 2.5

## 2011-08-02 MED ORDER — ENOXAPARIN SODIUM 40 MG/0.4ML ~~LOC~~ SOLN
40.0000 mg | SUBCUTANEOUS | Status: DC
  Administered 2011-08-02: 40 mg via SUBCUTANEOUS
  Filled 2011-08-02: qty 0.4

## 2011-08-02 MED ORDER — SODIUM CHLORIDE 0.9 % IJ SOLN: 3.0000 mL | INTRAMUSCULAR | Status: DC | PRN

## 2011-08-02 MED ORDER — LORAZEPAM 0.5 MG PO TABS: 0.5000 mg | ORAL_TABLET | ORAL | Status: DC | PRN

## 2011-08-02 MED ORDER — ACETAMINOPHEN 650 MG RE SUPP: 650.0000 mg | Freq: Four times a day (QID) | RECTAL | Status: DC | PRN

## 2011-08-02 MED ORDER — SENNOSIDES-DOCUSATE SODIUM 8.6-50 MG PO TABS: 1.0000 | ORAL_TABLET | Freq: Every day | ORAL | Status: DC | PRN

## 2011-08-02 MED ORDER — ALBUTEROL SULFATE (5 MG/ML) 0.5% IN NEBU
2.5000 mg | INHALATION_SOLUTION | Freq: Four times a day (QID) | RESPIRATORY_TRACT | Status: DC | PRN
  Administered 2011-08-02 – 2011-08-03 (×2): 2.5 mg via RESPIRATORY_TRACT
  Filled 2011-08-02 (×2): qty 0.5

## 2011-08-02 MED ORDER — ZOLPIDEM TARTRATE 5 MG PO TABS: 5.0000 mg | ORAL_TABLET | Freq: Every evening | ORAL | Status: DC | PRN

## 2011-08-02 MED ORDER — ALBUTEROL SULFATE (5 MG/ML) 0.5% IN NEBU
2.5000 mg | INHALATION_SOLUTION | Freq: Once | RESPIRATORY_TRACT | Status: AC
  Administered 2011-08-02: 2.5 mg via RESPIRATORY_TRACT
  Filled 2011-08-02: qty 0.5

## 2011-08-02 MED ORDER — LOSARTAN POTASSIUM 50 MG PO TABS
100.0000 mg | ORAL_TABLET | Freq: Every day | ORAL | Status: DC
  Administered 2011-08-02 – 2011-08-03 (×2): 100 mg via ORAL
  Filled 2011-08-02 (×2): qty 2

## 2011-08-02 MED ORDER — BIOTENE DRY MOUTH MT LIQD
Freq: Two times a day (BID) | OROMUCOSAL | Status: DC
  Administered 2011-08-02 – 2011-08-03 (×2): via OROMUCOSAL

## 2011-08-02 MED ORDER — ONDANSETRON HCL 4 MG/2ML IJ SOLN: 4.0000 mg | Freq: Four times a day (QID) | INTRAMUSCULAR | Status: DC | PRN

## 2011-08-02 NOTE — ED Notes (Signed)
Family at bedside. 

## 2011-08-02 NOTE — ED Notes (Signed)
Pt showing NSR on CCM. Given Sprite to drink. Still c/o pain in his left chest. Occasional cough noted.

## 2011-08-02 NOTE — ED Notes (Signed)
IV infiltrated. IV line pulled with cath intact. Pt states site is tender. Will continue to monitor. New IV site started.

## 2011-08-02 NOTE — ED Notes (Signed)
Pt c/o chest pain that woke him up this am from sleep. Also c/o shortness of breath. Pt states that he is having sharp constant pain in his chest. Also c/o being stressed out because he buried his 56 year old daughter on Saturday.

## 2011-08-02 NOTE — ED Provider Notes (Signed)
History   Chart scribed for Nimish C Gosrani by [facility]; the patient was seen in room [facility]; this patient's care was started at 8:50 AM.    CSN: [id number] Arrival date & time: 08/02/2011  8:14 AM  Chief Complaint  Patient presents with  . Chest Pain   HPI Corey Carson is a 55 y.o. male who presents to the Emergency Department complaining of chest pain. Pt reports sharp left sided chest pain sudden in onset this AM, woke pt from sleep, lasting 3-4 minutes and then improved. Pain is still present as mild discomfort. Pain was associated with tingling to left fingers and diaphoresis. Pt also c/o sob but thinks d/t asthma. No cough, congestion, fever, chills, neck pain, dizziness, n/v, or ha. Pt has not taken BP meds for past 2 weeks. Non smoker / non drinker. Pt h/o asthma, HTN (poorly controlled) and cardiac cath 7-8 years ago. +Fh/o HTN and CAD in father. Pt also reports significant home stress, daughter passed away last week.  PCP Dr. Phillips Odor  Past Medical History  Diagnosis Date  . Hypertension   . Asthma     Past Surgical History  Procedure Date  . Hernia repair     History reviewed. No pertinent family history.  History  Substance Use Topics  . Smoking status: Never Smoker   . Smokeless tobacco: Not on file  . Alcohol Use: No   Previous Medications   ALBUTEROL (PROVENTIL) (2.5 MG/3ML) 0.083% NEBULIZER SOLUTION    Take 2.5 mg by nebulization every 6 (six) hours as needed. For shortness of breath     GUAIFENESIN (ROBITUSSIN) 100 MG/5ML LIQUID    Take 200 mg by mouth 3 (three) times daily as needed. For cough    IBUPROFEN (ADVIL,MOTRIN) 200 MG TABLET    Take 600 mg by mouth daily as needed. For pain    LOSARTAN (COZAAR) 100 MG TABLET    Take 100 mg by mouth daily.     SODIUM-POTASSIUM BICARBONATE (ALKA-SELTZER GOLD) TBEF    Take 2 tablets by mouth daily as needed. For cough/colds      Allergies as of 08/02/2011 - Review Complete 08/02/2011  Allergen Reaction  Noted  . Sulfa antibiotics Shortness Of Breath and Swelling 08/02/2011      Review of Systems 10 Systems reviewed and are negative for acute change except as noted in the HPI.  Physical Exam  BP 155/90  Pulse 75  Temp(Src) 98.3 F (36.8 C) (Oral)  Resp 16  Wt 240 lb (108.863 kg)  SpO2 97%  Physical Exam  Nursing note and vitals reviewed. Constitutional: He is oriented to person, place, and time. No distress.       Appearance consistent with age of record  HENT:  Head: Normocephalic and atraumatic.  Right Ear: External ear normal.  Left Ear: External ear normal.  Nose: Nose normal.  Mouth/Throat: Oropharynx is clear and moist.  Eyes: Conjunctivae are normal.  Neck: Neck supple.  Cardiovascular: Normal rate and regular rhythm.  Exam reveals no gallop and no friction rub.   No murmur heard. Pulmonary/Chest: Effort normal. He has wheezes (slight expiratory wheezing). He has no rhonchi. He has no rales. He exhibits no tenderness.  Abdominal: Soft. There is no tenderness.  Musculoskeletal: Normal range of motion. He exhibits no edema and no tenderness.       Normal appearance of extremities  Neurological: He is alert and oriented to person, place, and time. No sensory deficit.  Skin: No rash noted.  Color normal  Psychiatric: He has a normal mood and affect.    ED Course  Procedures  OTHER DATA REVIEWED: Nursing notes and vital signs reviewed. Prior records reviewed.   DIAGNOSTIC STUDIES: Oxygen Saturation is 98% on 2 liters/min via Patient connected to nasal cannula oxygen, adequate by my Interpretation.  EKG:   Date: 08/02/2011  Rate:8-  Rhythm: normal sinus rhythm  QRS Axis: normal  Intervals: normal  ST/T Wave abnormalities: normal  Conduction Disutrbances:right bundle branch block  Narrative Interpretation:   Old EKG Reviewed: unchanged   Cardiac Monitor: 08/02/2011 8:52 AM Sinus, rate 86, no ectopy   LABS / RADIOLOGY: Results for orders placed  during the hospital encounter of 08/02/11  CBC      Component Value Range   WBC 11.3 (*) 4.0 - 10.5 (K/uL)   RBC 5.00  4.22 - 5.81 (MIL/uL)   Hemoglobin 13.9  13.0 - 17.0 (g/dL)   HCT 16.1  09.6 - 04.5 (%)   MCV 81.8  78.0 - 100.0 (fL)   MCH 27.8  26.0 - 34.0 (pg)   MCHC 34.0  30.0 - 36.0 (g/dL)   RDW 40.9  81.1 - 91.4 (%)   Platelets 299  150 - 400 (K/uL)  CARDIAC PANEL(CRET KIN+CKTOT+MB+TROPI)      Component Value Range   Total CK 106  7 - 232 (U/L)   CK, MB 3.6  0.3 - 4.0 (ng/mL)   Troponin I <0.30  <0.30 (ng/mL)   Relative Index 3.4 (*) 0.0 - 2.5   COMPREHENSIVE METABOLIC PANEL      Component Value Range   Sodium 140  135 - 145 (mEq/L)   Potassium 3.8  3.5 - 5.1 (mEq/L)   Chloride 104  96 - 112 (mEq/L)   CO2 25  19 - 32 (mEq/L)   Glucose, Bld 159 (*) 70 - 99 (mg/dL)   BUN 16  6 - 23 (mg/dL)   Creatinine, Ser 7.82  0.50 - 1.35 (mg/dL)   Calcium 9.7  8.4 - 95.6 (mg/dL)   Total Protein 7.1  6.0 - 8.3 (g/dL)   Albumin 3.9  3.5 - 5.2 (g/dL)   AST 20  0 - 37 (U/L)   ALT 19  0 - 53 (U/L)   Alkaline Phosphatase 88  39 - 117 (U/L)   Total Bilirubin 0.1 (*) 0.3 - 1.2 (mg/dL)   GFR calc non Af Amer >60  >60 (mL/min)   GFR calc Af Amer >60  >60 (mL/min)  POCT I-STAT TROPONIN I      Component Value Range   Troponin i, poc 0.04  0.00 - 0.08 (ng/mL)   Comment 3            Dg Chest Portable 1 View  08/02/2011  *RADIOLOGY REPORT*  Clinical Data: Chest pain  PORTABLE CHEST - 1 VIEW  Comparison: 02/05/2010  Findings: Lungs are clear. No pleural effusion or pneumothorax.  Moderate cardiomegaly.  Degenerative changes of the visualized thoracolumbar spine.  IMPRESSION: No evidence of acute cardiopulmonary disease.  Moderate cardiomegaly.  Original Report Authenticated By: Charline Bills, M.D.    Date: 08/02/2011  Rate: 80  Rhythm: normal sinus rhythm  QRS Axis: normal  Intervals: normal  ST/T Wave abnormalities: normal  Conduction Disutrbances:right bundle branch block  Narrative  Interpretation:   Old EKG Reviewed: unchanged    ED COURSE:  MDM: Chest pain with moderate risk factors. Negative acute EKG. Negative enzymes. Admit to telemetry.  IMPRESSION: 1. Chest pain, unspecified  PLAN:  All results reviewed and discussed with pt, questions answered, pt agreeable with plan.      MEDS GIVEN IN ED:  Medications  sodium chloride 0.9 % injection 3 mL (not administered)  ibuprofen (ADVIL,MOTRIN) 200 MG tablet (not administered)  albuterol (PROVENTIL) (2.5 MG/3ML) 0.083% nebulizer solution (not administered)  sodium-potassium bicarbonate (ALKA-SELTZER GOLD) TBEF (not administered)  guaiFENesin (ROBITUSSIN) 100 MG/5ML liquid (not administered)  losartan (COZAAR) 100 MG tablet (not administered)  aspirin chewable tablet 324 mg (324 mg Oral Given 08/02/11 0827)  0.9 %  sodium chloride infusion (  Intravenous New Bag 08/02/11 0831)  nitroGLYCERIN (NITROSTAT) SL tablet 0.4 mg (0.4 mg Sublingual Given 08/02/11 1148)  albuterol (PROVENTIL) (5 MG/ML) 0.5% nebulizer solution 2.5 mg (2.5 mg Nebulization Given 08/02/11 1156)  ipratropium (ATROVENT) 0.02 % nebulizer solution 0.5 mg (0.5 mg Nebulization Given 08/02/11 1157)     DISCHARGE MEDICATIONS: New Prescriptions   No medications on file     SCRIBE ATTESTATION:I personally performed the services described in this documentation, which was scribed in my presence. The recorded information has been reviewed and considered. Nimish C Gosrani

## 2011-08-02 NOTE — ED Notes (Signed)
Pt resting on stretcher at this time. CCM showing NSR. O2 at 2L/min via [place] on patient. IV infusing with no edema or redness.

## 2011-08-02 NOTE — H&P (Signed)
Corey Carson MRN: [id number] DOB/AGE: June 17, 1956 55 y.o. Primary Care Physician:GOLDING,JOHN CABOT, MD Admit date: 08/02/2011 Chief Complaint: Chest pain. HPI: This 55 year old man this morning had sudden onset of left-sided chest pain near his exam. The pain was sharp in nature and is still present now, approximately 5 hours later. Pain does not radiate. He has been coughing significantly yesterday, the cough being nonproductive. He also has been feeling hot as if he had a fever. There is no previous history of coronary artery disease. In particular he did have cardiac catheterization in 2007 which showed that there was no significant coronary artery disease. Risk factors include hypertension. He is a nonsmoker. There is no family history of artery coronary artery disease. He does not have diabetes. He is not on any medication for hyperlipidemia.    Past medical history: 1. Hypertension. 2. Asthma.  Past surgical history: 1. Umbilical hernia repair.      Family history: Noncontributory.  Social history: He has been married for approximately 15 years. He does not smoke cigarettes. He does not drink excessive alcohol. He is employed by a company that is in [employer]. Unfortunately, his daughter was shot and killed by her husband only a few days ago.  Allergies:  Allergies  Allergen Reactions  . Sulfa Antibiotics Shortness Of Breath and Swelling    Medications Prior to Admission  Medication Dose Route Frequency Provider Last Rate Last Dose  . 0.9 %  sodium chloride infusion   Intravenous Once Donnetta Hutching, MD 100 mL/hr at 08/02/11 0831    . albuterol (PROVENTIL) (5 MG/ML) 0.5% nebulizer solution 2.5 mg  2.5 mg Nebulization Once Donnetta Hutching, MD   2.5 mg at 08/02/11 1156  . aspirin chewable tablet 324 mg  324 mg Oral Once Donnetta Hutching, MD   324 mg at 08/02/11 0827  . ipratropium (ATROVENT) 0.02 % nebulizer solution 0.5 mg  0.5 mg Nebulization Once Donnetta Hutching, MD   0.5 mg at  08/02/11 1157  . nitroGLYCERIN (NITROSTAT) SL tablet 0.4 mg  0.4 mg Sublingual Once Donnetta Hutching, MD   0.4 mg at 08/02/11 1148  . sodium chloride 0.9 % injection 3 mL  3 mL Intravenous PRN Donnetta Hutching, MD       No current outpatient prescriptions on file as of 08/02/2011.       HQI:ONGEX from the symptoms mentioned above,there are no other symptoms referable to all systems reviewed.  Physical Exam: Blood pressure 155/90, pulse 75, temperature 98.3 F (36.8 C), temperature source Oral, resp. rate 16, weight 108.863 kg (240 lb), SpO2 97.00%. Systemically well. He does not appear to be in acute pain. There is no increased work of breathing. There is no respiratory distress. There is no clubbing, peripheral or central cyanosis. Cardiovascular: Heart sounds are present and normal without murmurs. There is no pericardial rub. There is no gallop rhythm. Respiratory: Lung fields are entirely clear without evidence of pleural rub. Musculoskeletal: Left anterior chest wall is tender, reproducing his pain. Abdomen: Soft, nontender, no masses, no hepatosplenomegaly. Neurological: Alert and orientated without any focal neurological signs. Skin: No abnormalities.  Results for orders placed during the hospital encounter of 08/02/11 (from the past 48 hour(s))  POCT I-STAT TROPONIN I     Status: Normal   Collection Time   08/02/11  8:21 AM      Component Value Range Comment   Troponin i, poc 0.04  0.00 - 0.08 (ng/mL)    Comment 3  CBC     Status: Abnormal   Collection Time   08/02/11  8:26 AM      Component Value Range Comment   WBC 11.3 (*) 4.0 - 10.5 (K/uL)    RBC 5.00  4.22 - 5.81 (MIL/uL)    Hemoglobin 13.9  13.0 - 17.0 (g/dL)    HCT 21.3  08.6 - 57.8 (%)    MCV 81.8  78.0 - 100.0 (fL)    MCH 27.8  26.0 - 34.0 (pg)    MCHC 34.0  30.0 - 36.0 (g/dL)    RDW 46.9  62.9 - 52.8 (%)    Platelets 299  150 - 400 (K/uL)   CARDIAC PANEL(CRET KIN+CKTOT+MB+TROPI)     Status: Abnormal   Collection  Time   08/02/11  8:26 AM      Component Value Range Comment   Total CK 106  7 - 232 (U/L)    CK, MB 3.6  0.3 - 4.0 (ng/mL)    Troponin I <0.30  <0.30 (ng/mL)    Relative Index 3.4 (*) 0.0 - 2.5    COMPREHENSIVE METABOLIC PANEL     Status: Abnormal   Collection Time   08/02/11  8:26 AM      Component Value Range Comment   Sodium 140  135 - 145 (mEq/L)    Potassium 3.8  3.5 - 5.1 (mEq/L)    Chloride 104  96 - 112 (mEq/L)    CO2 25  19 - 32 (mEq/L)    Glucose, Bld 159 (*) 70 - 99 (mg/dL)    BUN 16  6 - 23 (mg/dL)    Creatinine, Ser 4.13  0.50 - 1.35 (mg/dL)    Calcium 9.7  8.4 - 10.5 (mg/dL)    Total Protein 7.1  6.0 - 8.3 (g/dL)    Albumin 3.9  3.5 - 5.2 (g/dL)    AST 20  0 - 37 (U/L)    ALT 19  0 - 53 (U/L)    Alkaline Phosphatase 88  39 - 117 (U/L)    Total Bilirubin 0.1 (*) 0.3 - 1.2 (mg/dL)    GFR calc non Af Amer >60  >60 (mL/min)    GFR calc Af Amer >60  >60 (mL/min)    No results found for this or any previous visit (from the past 240 hour(s)).  Dg Chest Portable 1 View  08/02/2011  *RADIOLOGY REPORT*  Clinical Data: Chest pain  PORTABLE CHEST - 1 VIEW  Comparison: 02/05/2010  Findings: Lungs are clear. No pleural effusion or pneumothorax.  Moderate cardiomegaly.  Degenerative changes of the visualized thoracolumbar spine.  IMPRESSION: No evidence of acute cardiopulmonary disease.  Moderate cardiomegaly.  Original Report Authenticated By: Charline Bills, M.D.   ECG: Normal sinus rhythm, right bundle branch block pattern, T wave inversions inferiorly and laterally with ST depression inferiorly. I do not know if this is old or new as I do not have previous electrocardiograms to compare. Impression: 1. Atypical chest pain, left side. Abnormal electrocardiogram. 2. Hypertension.     Plan: 1. Admit to telemetry. 2. Serial cardiac enzymes and electrocardiogram. 3. Consider cardiology consultation.      GOSRANI,NIMISH C 08/02/2011, 1:06 PM

## 2011-08-03 LAB — COMPREHENSIVE METABOLIC PANEL
ALT: 27 U/L (ref 0–53)
Alkaline Phosphatase: 73 U/L (ref 39–117)
BUN: 10 mg/dL (ref 6–23)
CO2: 28 mEq/L (ref 19–32)
GFR calc Af Amer: >60 mL/min (ref >60)
GFR calc non Af Amer: >60 mL/min (ref >60)
Glucose, Bld: 134 mg/dL — ABNORMAL HIGH (ref 70–99)
Potassium: 3.6 mEq/L (ref 3.5–5.1)
Sodium: 140 mEq/L (ref 135–145)
Total Bilirubin: 0.3 mg/dL (ref 0.3–1.2)

## 2011-08-03 LAB — CBC
HCT: 37.7 % — ABNORMAL LOW (ref 39.0–52.0)
Hemoglobin: 12.4 g/dL — ABNORMAL LOW (ref 13.0–17.0)
RBC: 4.58 MIL/uL (ref 4.22–5.81)

## 2011-08-03 LAB — CARDIAC PANEL(CRET KIN+CKTOT+MB+TROPI)
Relative Index: INVALID (ref 0.0–2.5)
Troponin I: <0.3 ng/mL (ref <0.30)

## 2011-08-03 NOTE — Progress Notes (Signed)
Spiritual Care-- Referred by [facility] office.   Patient discussed at length the death of his daughter.  We discussed grief process.  His wife came in about midpoint of our discussion and shared more of their support and process for family, along with coping and faith support.   Discussed points around normalizing grief and brought grief support materials for adult and children.

## 2011-08-03 NOTE — Discharge Summary (Signed)
Physician Discharge Summary  Patient ID: Corey Carson MRN: [id number] DOB/AGE: October 22, 1956 55 y.o. Primary Care Physician:GOLDING,JOHN CABOT, MD Admit date: 08/02/2011 Discharge date: 08/03/2011    Discharge Diagnoses:  1. Atypical chest pain, likely musculoskeletal. 2. Hypertension.   Current Discharge Medication List    CONTINUE these medications which have NOT CHANGED   Details  albuterol (PROVENTIL) (2.5 MG/3ML) 0.083% nebulizer solution Take 2.5 mg by nebulization every 6 (six) hours as needed. For shortness of breath      guaiFENesin (ROBITUSSIN) 100 MG/5ML liquid Take 200 mg by mouth 3 (three) times daily as needed. For cough     ibuprofen (ADVIL,MOTRIN) 200 MG tablet Take 600 mg by mouth daily as needed. For pain     losartan (COZAAR) 100 MG tablet Take 100 mg by mouth daily.      sodium-potassium bicarbonate (ALKA-SELTZER GOLD) TBEF Take 2 tablets by mouth daily as needed. For cough/colds         Discharged Condition: Stable and improved.    Consults: None.  Significant Diagnostic Studies: Dg Chest Portable 1 View  08/02/2011  *RADIOLOGY REPORT*  Clinical Data: Chest pain  PORTABLE CHEST - 1 VIEW  Comparison: 02/05/2010  Findings: Lungs are clear. No pleural effusion or pneumothorax.  Moderate cardiomegaly.  Degenerative changes of the visualized thoracolumbar spine.  IMPRESSION: No evidence of acute cardiopulmonary disease.  Moderate cardiomegaly.  Original Report Authenticated By: Charline Bills, M.D.    Lab Results: Results for orders placed during the hospital encounter of 08/02/11 (from the past 48 hour(s))  POCT I-STAT TROPONIN I     Status: Normal   Collection Time   08/02/11  8:21 AM      Component Value Range Comment   Troponin i, poc 0.04  0.00 - 0.08 (ng/mL)    Comment 3            CBC     Status: Abnormal   Collection Time   08/02/11  8:26 AM      Component Value Range Comment   WBC 11.3 (*) 4.0 - 10.5 (K/uL)    RBC 5.00  4.22 - 5.81  (MIL/uL)    Hemoglobin 13.9  13.0 - 17.0 (g/dL)    HCT 40.9  81.1 - 91.4 (%)    MCV 81.8  78.0 - 100.0 (fL)    MCH 27.8  26.0 - 34.0 (pg)    MCHC 34.0  30.0 - 36.0 (g/dL)    RDW 78.2  95.6 - 21.3 (%)    Platelets 299  150 - 400 (K/uL)   CARDIAC PANEL(CRET KIN+CKTOT+MB+TROPI)     Status: Abnormal   Collection Time   08/02/11  8:26 AM      Component Value Range Comment   Total CK 106  7 - 232 (U/L)    CK, MB 3.6  0.3 - 4.0 (ng/mL)    Troponin I <0.30  <0.30 (ng/mL)    Relative Index 3.4 (*) 0.0 - 2.5    COMPREHENSIVE METABOLIC PANEL     Status: Abnormal   Collection Time   08/02/11  8:26 AM      Component Value Range Comment   Sodium 140  135 - 145 (mEq/L)    Potassium 3.8  3.5 - 5.1 (mEq/L)    Chloride 104  96 - 112 (mEq/L)    CO2 25  19 - 32 (mEq/L)    Glucose, Bld 159 (*) 70 - 99 (mg/dL)    BUN 16  6 - 23 (mg/dL)  Creatinine, Ser 0.82  0.50 - 1.35 (mg/dL)    Calcium 9.7  8.4 - 10.5 (mg/dL)    Total Protein 7.1  6.0 - 8.3 (g/dL)    Albumin 3.9  3.5 - 5.2 (g/dL)    AST 20  0 - 37 (U/L)    ALT 19  0 - 53 (U/L)    Alkaline Phosphatase 88  39 - 117 (U/L)    Total Bilirubin 0.1 (*) 0.3 - 1.2 (mg/dL)    GFR calc non Af Amer >60  >60 (mL/min)    GFR calc Af Amer >60  >60 (mL/min)   CARDIAC PANEL(CRET KIN+CKTOT+MB+TROPI)     Status: Normal   Collection Time   08/02/11  3:30 PM      Component Value Range Comment   Total CK 93  7 - 232 (U/L)    CK, MB 3.3  0.3 - 4.0 (ng/mL)    Troponin I <0.30  <0.30 (ng/mL)    Relative Index RELATIVE INDEX IS INVALID  0.0 - 2.5    CARDIAC PANEL(CRET KIN+CKTOT+MB+TROPI)     Status: Normal   Collection Time   08/02/11 10:52 PM      Component Value Range Comment   Total CK 80  7 - 232 (U/L)    CK, MB 3.2  0.3 - 4.0 (ng/mL)    Troponin I <0.30  <0.30 (ng/mL)    Relative Index RELATIVE INDEX IS INVALID  0.0 - 2.5    CBC     Status: Abnormal   Collection Time   08/03/11  4:44 AM      Component Value Range Comment   WBC 7.0  4.0 - 10.5 (K/uL)     RBC 4.58  4.22 - 5.81 (MIL/uL)    Hemoglobin 12.4 (*) 13.0 - 17.0 (g/dL)    HCT 16.1 (*) 09.6 - 52.0 (%)    MCV 82.3  78.0 - 100.0 (fL)    MCH 27.1  26.0 - 34.0 (pg)    MCHC 32.9  30.0 - 36.0 (g/dL)    RDW 04.5  40.9 - 81.1 (%)    Platelets 245  150 - 400 (K/uL)   COMPREHENSIVE METABOLIC PANEL     Status: Abnormal   Collection Time   08/03/11  4:44 AM      Component Value Range Comment   Sodium 140  135 - 145 (mEq/L)    Potassium 3.6  3.5 - 5.1 (mEq/L)    Chloride 102  96 - 112 (mEq/L)    CO2 28  19 - 32 (mEq/L)    Glucose, Bld 134 (*) 70 - 99 (mg/dL)    BUN 10  6 - 23 (mg/dL)    Creatinine, Ser 9.14  0.50 - 1.35 (mg/dL)    Calcium 8.9  8.4 - 10.5 (mg/dL)    Total Protein 6.6  6.0 - 8.3 (g/dL)    Albumin 3.5  3.5 - 5.2 (g/dL)    AST 31  0 - 37 (U/L)    ALT 27  0 - 53 (U/L)    Alkaline Phosphatase 73  39 - 117 (U/L)    Total Bilirubin 0.3  0.3 - 1.2 (mg/dL)    GFR calc non Af Amer >60  >60 (mL/min)    GFR calc Af Amer >60  >60 (mL/min)       Hospital Course: This very pleasant 55 year old man, who recently lost his daughter a few days ago, came in with left-sided chest pain. The pain was sharp in nature  and did not radiate. It did not make him short of breath and there was no hemoptysis. He has been admitted overnight and serial cardiac enzymes are negative. His electrocardiogram is abnormal but this probably is not a new finding. The character of the pain was not convincing for cardiac pain also. His daughter was murdered by her husband a few days ago and I wonder if this is causing some of his symptoms. Today he feels better, his pain is lessened significantly.  Discharge Exam: Blood pressure 137/74, pulse 77, temperature 97.8 F (36.6 C), temperature source Oral, resp. rate 18, height 6' (1.829 m), weight 107.6 kg (237 lb 3.4 oz), SpO2 96.00%. He looks systemically well. Heart sounds are present and normal. Lung fields are clear. There is no pericardial or pleural rub. He is  alert and oriented without any focal neurological signs.  Disposition: Home. He does have an appointment with his primary care physician in the next week or 2 and I have encouraged him to keep this appointment.  Discharge Orders    Future Orders Please Complete By Expires   Diet - low sodium heart healthy      Increase activity slowly           Signed: GOSRANI,NIMISH C 08/03/2011, 10:39 AM

## 2011-08-03 NOTE — Progress Notes (Signed)
UR Chart Review Completed  

## 2011-08-16 NOTE — Progress Notes (Signed)
Encounter addended by: Angela Lilly on: 08/16/2011 11:42 AM<BR>     Documentation filed: Flowsheet VN

## 2011-08-25 LAB — DIFFERENTIAL
Eosinophils Absolute: 0.2
Lymphocytes Relative: 15
Lymphs Abs: 1.5
Monocytes Relative: 7
Neutro Abs: 7.2
Neutrophils Relative %: 75

## 2011-08-25 LAB — BASIC METABOLIC PANEL
BUN: 11
Creatinine, Ser: 0.76
GFR calc Af Amer: >60
GFR calc non Af Amer: >60

## 2011-08-25 LAB — URINALYSIS, ROUTINE W REFLEX MICROSCOPIC
Glucose, UA: NEGATIVE
Ketones, ur: NEGATIVE
Nitrite: NEGATIVE
Protein, ur: NEGATIVE
pH: 6

## 2011-08-25 LAB — CBC
Platelets: 353
RBC: 5.29
WBC: 9.5

## 2011-08-31 LAB — DIFFERENTIAL
Basophils Relative: 1
Eosinophils Relative: 2
Monocytes Absolute: 0.3
Monocytes Relative: 4
Neutro Abs: 6.1

## 2011-08-31 LAB — CBC
HCT: 35.9 — ABNORMAL LOW
Hemoglobin: 12.5 — ABNORMAL LOW
MCHC: 34.7
RBC: 4.4

## 2011-08-31 LAB — BASIC METABOLIC PANEL
CO2: 27
Calcium: 8.6
Chloride: 105
GFR calc Af Amer: >60
Potassium: 3 — ABNORMAL LOW
Sodium: 136

## 2012-02-02 NOTE — H&P (Signed)
  NTS SOAP Note  Vital Signs:  Vitals as of: 02/02/2012: Systolic 147: Diastolic 102: Heart Rate 81: Temp 1F: Height 60ft 9in: Weight 264Lbs 0 Ounces: OFC 0in: Respiratory Rate 0: O2 Saturation 0: Pain Level 0: BMI 39  BMI : 38.99 kg/m2  Subjective: This 56 Years 61 Months old Male presents forscreening TCS.  Never has had a TCS.  No gi complaints.  No family h/o colon cancer.  Review of Symptoms:  Constitutional:unremarkable Head:unremarkable Eyes:unremarkable ear/sinus problems Cardiovascular:unremarkable Respiratory:wheezing, SOB Genitourinary:unremarkable Musculoskeletal:unremarkable Skin:unremarkable Hematolgic/Lymphatic:unremarkable Allergic/Immunologic:unremarkable   Past Medical History:Reviewed   Past Medical History  Surgical History: hernia Medical Problems:  High Blood pressure Allergies: nkda Medications: bp pill   Social History:Reviewed   Social History  Preferred Language: English ([place]) Race:  Black or African American Ethnicity: Not Hispanic / Latino Age: 56 Years 8 Months Marital Status:  M Alcohol:  No Recreational drug(s):  No   Smoking Status: Never smoker reviewed on 02/02/2012  Family History:Reviewed   Family History  Is there a family history ZO:[id number], CAD.  No family h/o colon cancer    Objective Information: General:Well appearing, well nourished in no distress. Skin:no rash or prominent lesions Heart:RRR, no murmur or gallop.  Normal S1, S2.  No S3, S4.  Lungs:CTA bilaterally, no wheezes, rhonchi, rales.  Breathing unlabored. Abdomen:Soft, NT/ND, no HSM, no masses. deferred to procedure  Assessment:Need for screening TCS  Diagnosis &amp; Procedure: DiagnosisCode: V76.51, ProcedureCode: 09811,    Plan:Scheduled for TCS on 02/14/12.   Patient Education:Alternative treatments to surgery were discussed with patient (and family).Risks and  benefits  of procedure were fully explained to the patient (and family) who gave informed consent. Patient/family questions were addressed.  Follow-up:Pending Surgery

## 2012-02-13 ENCOUNTER — Encounter (HOSPITAL_COMMUNITY): Payer: Self-pay | Admitting: Pharmacy Technician

## 2012-02-13 MED ORDER — SODIUM CHLORIDE 0.45 % IV SOLN
Freq: Once | INTRAVENOUS | Status: AC
  Administered 2012-02-14: 08:00:00 via INTRAVENOUS

## 2012-02-14 ENCOUNTER — Encounter (HOSPITAL_COMMUNITY)
Admission: RE | Disposition: A | Discharge status: Home / Self Care | Payer: Self-pay | Source: Ambulatory Visit | Attending: General Surgery

## 2012-02-14 ENCOUNTER — Ambulatory Visit (HOSPITAL_COMMUNITY)
Admission: RE | Admit: 2012-02-14 | Discharge: 2012-02-14 | Disposition: A | Discharge status: Home / Self Care | Payer: 59 | Source: Ambulatory Visit | Attending: General Surgery | Admitting: General Surgery

## 2012-02-14 ENCOUNTER — Encounter (HOSPITAL_COMMUNITY): Payer: Self-pay | Admitting: *Deleted

## 2012-02-14 DIAGNOSIS — Z79899 Other long term (current) drug therapy: Secondary | ICD-10-CM | POA: Insufficient documentation

## 2012-02-14 DIAGNOSIS — I10 Essential (primary) hypertension: Secondary | ICD-10-CM | POA: Insufficient documentation

## 2012-02-14 DIAGNOSIS — Z1211 Encounter for screening for malignant neoplasm of colon: Secondary | ICD-10-CM | POA: Insufficient documentation

## 2012-02-14 HISTORY — PX: COLONOSCOPY: SHX5424

## 2012-02-14 SURGERY — COLONOSCOPY
Anesthesia: Moderate Sedation

## 2012-02-14 MED ORDER — STERILE WATER FOR IRRIGATION IR SOLN
Status: DC | PRN
  Administered 2012-02-14: 09:00:00

## 2012-02-14 MED ORDER — MIDAZOLAM HCL 5 MG/5ML IJ SOLN
INTRAMUSCULAR | Status: AC
  Filled 2012-02-14: qty 5

## 2012-02-14 MED ORDER — MEPERIDINE HCL 50 MG/ML IJ SOLN
INTRAMUSCULAR | Status: AC
  Filled 2012-02-14: qty 1

## 2012-02-14 MED ORDER — MIDAZOLAM HCL 5 MG/5ML IJ SOLN
INTRAMUSCULAR | Status: DC | PRN
  Administered 2012-02-14: 1 mg via INTRAVENOUS
  Administered 2012-02-14: 4 mg via INTRAVENOUS

## 2012-02-14 MED ORDER — MEPERIDINE HCL 25 MG/ML IJ SOLN
INTRAMUSCULAR | Status: DC | PRN
  Administered 2012-02-14: 50 mg via INTRAVENOUS

## 2012-02-14 NOTE — Discharge Instructions (Signed)
Colonoscopy Care After Read the instructions outlined below and refer to this sheet in the next few weeks. These discharge instructions provide you with general information on caring for yourself after you leave the hospital. Your doctor may also give you specific instructions. While your treatment has been planned according to the most current medical practices available, unavoidable complications occasionally occur. If you have any problems or questions after discharge, call your doctor. HOME CARE INSTRUCTIONS ACTIVITY:  You may resume your regular activity, but move at a slower pace for the next 24 hours.   Take frequent rest periods for the next 24 hours.   Walking will help get rid of the air and reduce the bloated feeling in your belly (abdomen).   No driving for 24 hours (because of the medicine (anesthesia) used during the test).   You may shower.   Do not sign any important legal documents or operate any machinery for 24 hours (because of the anesthesia used during the test).  NUTRITION:  Drink plenty of fluids.   You may resume your normal diet as instructed by your doctor.   Begin with a light meal and progress to your normal diet. Heavy or fried foods are harder to digest and may make you feel sick to your stomach (nauseated).   Avoid alcoholic beverages for 24 hours or as instructed.  MEDICATIONS:  You may resume your normal medications unless your doctor tells you otherwise.  WHAT TO EXPECT TODAY:  Some feelings of bloating in the abdomen.   Passage of more gas than usual.   Spotting of blood in your stool or on the toilet paper.  IF YOU HAD POLYPS REMOVED DURING THE COLONOSCOPY:  No aspirin products for 7 days or as instructed.   No alcohol for 7 days or as instructed.   Eat a soft diet for the next 24 hours.  FINDING OUT THE RESULTS OF YOUR TEST Not all test results are available during your visit. If your test results are not back during the visit, make an  appointment with your caregiver to find out the results. Do not assume everything is normal if you have not heard from your caregiver or the medical facility. It is important for you to follow up on all of your test results.  SEEK IMMEDIATE MEDICAL CARE IF:  You have more than a spotting of blood in your stool.   Your belly is swollen (abdominal distention).   You are nauseated or vomiting.   You have a fever.   You have abdominal pain or discomfort that is severe or gets worse throughout the day.  Document Released: 07/05/2004 Document Revised: 11/10/2011 Document Reviewed: 07/03/2008 ExitCare Patient Information 2012 [employer]. 

## 2012-02-14 NOTE — Op Note (Signed)
[facility] [address] [place], [place]  [address]  COLONOSCOPY PROCEDURE REPORT  PATIENT:  Corey Carson, Corey Carson  MR#:  [id number] BIRTHDATE:  11-29-1956, 55 yrs. old  GENDER:  male ENDOSCOPIST:  Franky Macho, MD REF. BY:  Assunta Found, M.D. PROCEDURE DATE:  02/14/2012 PROCEDURE:  Average-risk screening colonoscopy G0121 ASA CLASS:  Class II INDICATIONS:  Screening MEDICATIONS:   Versed 5 mg IV, demerol 50 mg IV  DESCRIPTION OF PROCEDURE:   After the risks benefits and alternatives of the procedure were thoroughly explained, informed consent was obtained.  Digital rectal exam was performed and revealed no abnormalities.   The [id number] ([id number]) endoscope was introduced through the anus and advanced to the cecum, which was identified by both the appendix and ileocecal valve, without limitations.  The quality of the prep was adequate..  The instrument was then slowly withdrawn as the colon was fully examined.  FINDINGS:  A normal appearing cecum, ileocecal valve, and appendiceal orifice were identified. The ascending, hepatic flexure, transverse, splenic flexure, descending, sigmoid colon, and rectum appeared unremarkable. The scope was then withdrawn from the cecum and the procedure completed. COMPLICATIONS:  None ENDOSCOPIC IMPRESSION: 1) Normal colon RECOMMENDATIONS:  REPEAT EXAM:  In 5 year(s) for Colonoscopy.  ______________________________ Franky Macho, MD  CC:  Assunta Found, MD  n. Rosalie DoctorFranky Macho at 02/14/2012 09:18 AM  Marykay Lex, [phone]

## 2012-02-17 ENCOUNTER — Encounter (HOSPITAL_COMMUNITY): Payer: Self-pay | Admitting: General Surgery

## 2012-05-06 ENCOUNTER — Emergency Department (HOSPITAL_COMMUNITY)
Admission: EM | Admit: 2012-05-06 | Discharge: 2012-05-06 | Disposition: A | Discharge status: Home / Self Care | Payer: 59 | Attending: Emergency Medicine | Admitting: Emergency Medicine

## 2012-05-06 ENCOUNTER — Encounter (HOSPITAL_COMMUNITY): Payer: Self-pay | Admitting: *Deleted

## 2012-05-06 DIAGNOSIS — R51 Headache: Secondary | ICD-10-CM | POA: Insufficient documentation

## 2012-05-06 DIAGNOSIS — E7800 Pure hypercholesterolemia, unspecified: Secondary | ICD-10-CM | POA: Insufficient documentation

## 2012-05-06 DIAGNOSIS — H6690 Otitis media, unspecified, unspecified ear: Secondary | ICD-10-CM

## 2012-05-06 DIAGNOSIS — H9209 Otalgia, unspecified ear: Secondary | ICD-10-CM | POA: Insufficient documentation

## 2012-05-06 DIAGNOSIS — I10 Essential (primary) hypertension: Secondary | ICD-10-CM | POA: Insufficient documentation

## 2012-05-06 DIAGNOSIS — J45909 Unspecified asthma, uncomplicated: Secondary | ICD-10-CM | POA: Insufficient documentation

## 2012-05-06 HISTORY — DX: Pure hypercholesterolemia, unspecified: E78.00

## 2012-05-06 MED ORDER — AMOXICILLIN-POT CLAVULANATE 875-125 MG PO TABS: 1.0000 | ORAL_TABLET | Freq: Two times a day (BID) | ORAL | Status: AC

## 2012-05-06 MED ORDER — OXYCODONE-ACETAMINOPHEN 5-325 MG PO TABS: 2.0000 | ORAL_TABLET | Freq: Four times a day (QID) | ORAL | Status: AC | PRN

## 2012-05-06 MED ORDER — AMOXICILLIN-POT CLAVULANATE 875-125 MG PO TABS
1.0000 | ORAL_TABLET | Freq: Once | ORAL | Status: AC
  Administered 2012-05-06: 1 via ORAL
  Filled 2012-05-06: qty 1

## 2012-05-06 MED ORDER — OXYCODONE-ACETAMINOPHEN 5-325 MG PO TABS
2.0000 | ORAL_TABLET | Freq: Once | ORAL | Status: AC
  Administered 2012-05-06: 2 via ORAL
  Filled 2012-05-06: qty 2

## 2012-05-06 NOTE — ED Notes (Signed)
Left ear pain and left side headache that started this am, pain is associated with dizziness. denies any cough, congestion, n/v

## 2012-05-06 NOTE — Discharge Instructions (Signed)
Otitis Media, Adult  A middle ear infection is an infection in the space behind the eardrum. The medical name for this is "otitis media." It may happen after a common cold. It is caused by a germ that starts growing in that space. You may feel swollen glands in your neck on the side of the ear infection.  HOME CARE INSTRUCTIONS   · Take your medicine as directed until it is gone, even if you feel better after the first few days.  · Only take over-the-counter or prescription medicines for pain, discomfort, or fever as directed by your caregiver.  · Occasional use of a nasal decongestant a couple times per day may help with discomfort and help the eustachian tube to drain better.  Follow up with your caregiver in 10 to 14 days or as directed, to be certain that the infection has cleared. Not keeping the appointment could result in a chronic or permanent injury, pain, hearing loss and disability. If there is any problem keeping the appointment, you must call back to this facility for assistance.  SEEK IMMEDIATE MEDICAL CARE IF:   · You are not getting better in 2 to 3 days.  · You have pain that is not controlled with medication.  · You feel worse instead of better.  · You cannot use the medication as directed.  · You develop swelling, redness or pain around the ear or stiffness in your neck.  MAKE SURE YOU:   · Understand these instructions.  · Will watch your condition.  · Will get help right away if you are not doing well or get worse.  Document Released: 08/26/2004 Document Revised: 11/10/2011 Document Reviewed: 06/27/2008  ExitCare® Patient Information ©2012 [employer].

## 2012-05-06 NOTE — ED Provider Notes (Signed)
History   This chart was scribed for Corey Carson B. Bernette Mayers, MD by Toya Smothers. The patient was seen in room APA11/APA11. Patient's care was started at 0903.  CSN: [id number]  Arrival date & time 05/06/12  0903   First MD Initiated Contact with Patient 05/06/12 0914      Chief Complaint  Patient presents with  . Headache  . Otalgia    HPI  Corey Carson is a 56 y.o. male who presents to the Emergency Department complaining of sudden onset  severe constant L side ear pain  with associated HA, numbness, and dizziness, denying SOB or fever. Dizziness is described as off balance/vertigo and is similar to prior episodes. Pt has a h/o recurrent ear infection stating that symptoms are similar to previous episodes. Pt list a h/o HTN and Asthma and is currently taking medications Hyzaar Last Abx for ear was Augmentin (2-3 months ago).  Pt list PCP as Dr. Phillips Odor  Past Medical History  Diagnosis Date  . Hypertension   . Asthma   . High cholesterol     Past Surgical History  Procedure Date  . Hernia repair   . Cardiac catheterization 2007  . Appendectomy 2005  . Colonoscopy 02/14/2012    Procedure: COLONOSCOPY;  Surgeon: Dalia Heading, MD;  Location: [facility];  Service: Gastroenterology;  Laterality: N/A;    No family history on file.  History  Substance Use Topics  . Smoking status: Never Smoker   . Smokeless tobacco: Not on file  . Alcohol Use: No      Review of Systems  Neurological: Positive for dizziness, numbness (L ear) and headaches.  A complete 10 system review of systems was obtained and all systems are negative except as noted in the HPI and PMH.   Allergies  Sulfa antibiotics  Home Medications   Current Outpatient Rx  Name Route Sig Dispense Refill  . ALBUTEROL SULFATE (2.5 MG/3ML) 0.083% IN NEBU Nebulization Take 2.5 mg by nebulization every 6 (six) hours as needed. For shortness of breath      . ASPIRIN EC 81 MG PO TBEC Oral Take 81 mg by mouth daily.     . IBUPROFEN 200 MG PO TABS Oral Take 600 mg by mouth daily as needed. For pain     . LOSARTAN POTASSIUM 100 MG PO TABS Oral Take 100 mg by mouth daily.        BP 185/95  Pulse 67  Temp(Src) 97.8 F (36.6 C) (Oral)  Resp 16  Ht 5\' 11"  (1.803 m)  Wt 250 lb (113.399 kg)  BMI 34.87 kg/m2  SpO2 95%  Physical Exam  Nursing note and vitals reviewed. Constitutional: He is oriented to person, place, and time. He appears well-developed and well-nourished.  HENT:  Head: Normocephalic and atraumatic.  Right Ear: External ear normal.       L TM bulging w/ no pus.  Eyes: EOM are normal. Pupils are equal, round, and reactive to light.  Neck: Normal range of motion. Neck supple.  Cardiovascular: Normal rate, normal heart sounds and intact distal pulses.   Pulmonary/Chest: Effort normal and breath sounds normal.  Abdominal: Bowel sounds are normal. He exhibits no distension. There is no tenderness.  Musculoskeletal: Normal range of motion. He exhibits no edema and no tenderness.  Neurological: He is alert and oriented to person, place, and time. He has normal strength. No cranial nerve deficit or sensory deficit.  Skin: Skin is warm and dry. No rash noted.  Psychiatric: He has a normal mood and affect.    ED Course  Procedures (including critical care time) DIAGNOSTIC STUDIES: Oxygen Saturation is 95% on room air, adequate by my interpretation.    COORDINATION OF CARE: 09:22- Evaluated state of Pt's present illness, and recommended follow up with Otolaryngologist   Labs Reviewed - No data to display No results found.  No diagnosis found.  MDM  Probably a serous otitis, but given history of recurrent infections, will treat with Augmentin. Pain medications as needed. Wife is driving. Advised followup with ENT.   I personally performed the services described in the documentation, which were scribed in my presence. The recorded information has been reviewed and considered.     Kenza Munar B.     Jaiyanna Safran B. Bernette Mayers, MD 05/06/12 [phone]

## 2012-07-04 ENCOUNTER — Other Ambulatory Visit: Payer: Self-pay | Admitting: Urology

## 2012-07-06 ENCOUNTER — Encounter (HOSPITAL_COMMUNITY): Payer: Self-pay | Admitting: Pharmacy Technician

## 2012-07-10 ENCOUNTER — Encounter (HOSPITAL_COMMUNITY): Payer: Self-pay

## 2012-07-10 ENCOUNTER — Encounter (HOSPITAL_COMMUNITY)
Admission: RE | Admit: 2012-07-10 | Discharge: 2012-07-10 | Disposition: A | Discharge status: Home / Self Care | Payer: 59 | Source: Ambulatory Visit | Attending: Urology | Admitting: Urology

## 2012-07-10 HISTORY — DX: Sleep apnea, unspecified: G47.30

## 2012-07-10 HISTORY — DX: Malignant (primary) neoplasm, unspecified: C80.1

## 2012-07-10 LAB — CBC
HCT: 37.7 % — ABNORMAL LOW (ref 39.0–52.0)
Hemoglobin: 12.8 g/dL — ABNORMAL LOW (ref 13.0–17.0)
MCH: 27.7 pg (ref 26.0–34.0)
MCHC: 34 g/dL (ref 30.0–36.0)
MCV: 81.6 fL (ref 78.0–100.0)
Platelets: 296 K/uL (ref 150–400)
RBC: 4.62 MIL/uL (ref 4.22–5.81)
RDW: 13 % (ref 11.5–15.5)
WBC: 7.9 K/uL (ref 4.0–10.5)

## 2012-07-10 LAB — BASIC METABOLIC PANEL WITH GFR
BUN: 14 mg/dL (ref 6–23)
CO2: 24 meq/L (ref 19–32)
Calcium: 9.6 mg/dL (ref 8.4–10.5)
Chloride: 103 meq/L (ref 96–112)
Creatinine, Ser: 0.57 mg/dL (ref 0.50–1.35)
GFR calc Af Amer: >90 mL/min
GFR calc non Af Amer: >90 mL/min
Glucose, Bld: 121 mg/dL — ABNORMAL HIGH (ref 70–99)
Potassium: 3.9 meq/L (ref 3.5–5.1)
Sodium: 139 meq/L (ref 135–145)

## 2012-07-10 LAB — ABO/RH: ABO/RH(D): A POS

## 2012-07-10 LAB — SURGICAL PCR SCREEN
MRSA, PCR: NEGATIVE
Staphylococcus aureus: NEGATIVE

## 2012-07-10 NOTE — Progress Notes (Signed)
07/10/12 0914  OBSTRUCTIVE SLEEP APNEA  Have you ever been diagnosed with sleep apnea through a sleep study? No  Do you snore loudly (loud enough to be heard through closed doors)?  0  Do you often feel tired, fatigued, or sleepy during the daytime? 0  Has anyone observed you stop breathing during your sleep? 0  Do you have, or are you being treated for high blood pressure? 1  BMI more than 35 kg/m2? 1  Age over 57 years old? 1  Neck circumference greater than 40 cm/18 inches? 1  Gender: 1  Obstructive Sleep Apnea Score 5   Score 4 or greater  Updated health history;Results sent to PCP

## 2012-07-10 NOTE — Pre-Procedure Instructions (Signed)
States had abscessed tooth 3 weeks ago that required pre antibiotics and extraction- left lower side. States Dr Laverle Patter is aware. Patient states has resolved

## 2012-07-10 NOTE — Patient Instructions (Addendum)
Corey Carson  07/10/2012   Your procedure is scheduled on:  07/16/12  Monday  Surgery 1100-1430  Report to [facility] at 0830      AM.  Call this number if you have problems the morning of surgery: [phone]     Or PST   [id number]  Corey Carson INHALERS WITH YOU TO HOSPITAL  Remember:   Do not eat food:After Midnight.Saturday night as directed by office for bowel prep  May have clear liquids:ALL DAY Sunday UNTIL MIDNIGHT Sunday NIGHT OR BEDTIME   THEN NONE                                                   DRINK INCREASED AMOUNT OF FLUIDS Sunday   Clear liquids include soda, tea, black coffee, apple or grape juice, broth.  Take these medicines the morning of surgery with A SIP OF WATER: NONE   Do not wear jewelry, make-up or nail polish.  Do not wear lotions, powders, or perfumes. You may wear deodorant.  Do not shave 48 hours prior to surgery.  Do not bring valuables to the hospital.  Contacts, dentures or bridgework may not be worn into surgery.  Leave suitcase in the car. After surgery it may be brought to your room.  For patients admitted to the hospital, checkout time is 11:00 AM the day of discharge.   Patients discharged the day of surgery will not be allowed to drive home.  Name and phone number of your driver:  Wife Corey Carson                                                                    Special Instructions: CHG Shower Use Special Wash: 1/2 bottle night before surgery and 1/2 bottle morning of surgery. REGULAR SOAP FACE AND PRIVATES                              MEN-MAY SHAVE FACE MORNING OF SURGERY  Please read over the following fact sheets that you were given: MRSA Information

## 2012-07-15 NOTE — H&P (Signed)
Chief Complaint  Prostate Cancer   Reason For Visit  Reason for consult: To discuss treatment options for prostate cancer and specifically to consider a robotic prostatectomy for surgical treatment. Physician requesting consult: Dr. Jerilee Field PCP: Dr. Assunta Found   History of Present Illness  Corey Carson is a 56 year old who was noted to have a PSA of 6.98 prompting a prostate biopsy by Dr. Mena Goes on 05/29/12.  This demonstrated Gleason 3+4=7 adenocarcinoma of the prostate with 6 out of 12 biopsy cores positive for malignancy all on the right side of the prostate.  He has no family history of prostate cancer.  He has been thoroughly counseled by Dr. Mena Goes about his treatment options and is most interested in surgical treatment.  TNM stage: cT1c Nx Mx PSA: 6.98 Gleason score: 3+4=7 Biopsy (05/29/12): 6 out of 12 biopsy cores positive -- R apex (30%, 3+3=6), R lateral apex (5%, 3+3=6), R mid (10%, 3+3=6), R lateral mid (60%, 3+4=7), R base (5%, 3+3=6), R lateral base (40%, 3+3=6) Prostate volume: 61.7 cc  Nomogram: OC disease: 73% EPE: 17% SVI: 5% LNI: 2.6% PFS (surgery): 90%  Urinary function: He has minimal voiding symptoms. IPSS is 1. Erectile function: He denies erectile dysfunction. SHIM score is 24.   Past Medical History Problems  1. History of  Asthma 493.90 2. History of  Hypertension 401.9  Surgical History Problems  1. History of  Appendectomy 2. History of  Umbilical Hernia Repair   He has a large right lower quadrant scar related to a prior appendectomy. He also underwent umbilical hernia repair in 2003 which did not mention mesh repair in the operative note.   Current Meds 1. Aspirin 81 MG Oral Tablet; Therapy: (Recorded:25Mar2013) to 2. Cozaar TABS; Therapy: (Recorded:25Mar2013) to  Allergies Medication  1. Sulfa Drugs  Family History Problems  1. Paternal history of  Acute Myocardial Infarction V17.3 2. Maternal history of  Diabetes Mellitus  V18.0 3. Family history of  Family Health Status Number Of Children 1 son  2 daughters 4. Paternal history of  Hypertension V17.49 5. 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    Vitals Vital Signs [Data Includes: Last 1 Day]  30Jul2013 02:08PM  BMI Calculated: 36.54 BSA Calculated: 2.36 Height: 5 ft 11 in Weight: 261 lb  Blood Pressure: 129 / 85 Temperature: 97.9 F Heart Rate: 73  Physical Exam Constitutional: Well nourished and well developed . No acute distress.  ENT:. The ears and nose are normal in appearance.  Neck: The appearance of the neck is normal and no neck mass is present.  Pulmonary: No respiratory distress, normal respiratory rhythm and effort and clear bilateral breath sounds.  Cardiovascular: Heart rate and rhythm are normal . No peripheral edema.  Abdomen: right lower quadrant, periumbilical incision site(s) well healed. The abdomen is obese. The abdomen is soft and nontender. No masses are palpated. No CVA tenderness. No hernias are palpable. No hepatosplenomegaly noted.  Rectal: Prostate size is estimated to be 40 g. The prostate has no nodularity and is not indurated.  Lymphatics: The  femoral and inguinal nodes are not enlarged or tender.  Skin: Normal skin turgor, no visible rash and no visible skin lesions.  Neuro/Psych:. Mood and affect are appropriate.    Results/Data Urine [Data Includes: Last 1 Day]   30Jul2013 COLOR YELLOW  APPEARANCE CLEAR  SPECIFIC GRAVITY 1.025  pH 5.5  GLUCOSE NEG  mg/dL BILIRUBIN NEG  KETONE NEG mg/dL BLOOD NEG  PROTEIN NEG mg/dL UROBILINOGEN 0.2 mg/dL NITRITE NEG  LEUKOCYTE ESTERASE NEG    I independently reviewed his medical records, PSA results, and pathology report with findings as dictated above.   Plan Health Maintenance (V70.0)  1. UA With REFLEX  Done: 30Jul2013 01:59PM Prostate Cancer (185)  2. Follow-up Schedule Surgery Office  Follow-up  Done: 30Jul2013 3. PT Referral Referral  Referral  Requested for: 14Aug2013  Discussion/Summary  1. Prostate cancer: I had a detailed discussion with Corey Carson and his wife today regarding treatment options. He feels very well informed from his prior discussions with Dr. Mena Goes and adamantly wishes to proceed with surgical therapy.   The patient was counseled about the natural history of prostate cancer and the standard treatment options that are available for prostate cancer. It was explained to him how his age and life expectancy, clinical stage, Gleason score, and PSA affect his prognosis, the decision to proceed with additional staging studies, as well as how that information influences recommended treatment strategies. We discussed the roles for active surveillance, radiation therapy, surgical therapy, androgen deprivation, as well as ablative therapy options for the treatment of prostate cancer as appropriate to his individual cancer situation. We discussed the risks and benefits of these options with regard to their impact on cancer control and also in terms of potential adverse events, complications, and impact on quiality of life particularly related to urinary, bowel, and sexual function. The patient was encouraged to ask questions throughout the discussion today and all questions were answered to his stated satisfaction. In addition, the patient was provided with and/or directed to appropriate resources and literature for further education about prostate cancer and treatment options.   We  discussed surgical therapy for prostate cancer including the different available surgical approaches. We discussed, in detail, the risks and expectations of surgery with regard to cancer control, urinary control, and erectile function as well as the expected postoperative recovery process. Additional risks of surgery including but not limited to bleeding, infection, hernia formation, nerve damage, lymphocele formation, bowel/rectal injury potentially necessitating colostomy, damage to the urinary tract resulting in urine leakage, urethral stricture, and the cardiopulmonary risks such as myocardial infarction, stroke, death, venothromboembolism, etc. were explained. The risk of open surgical conversion for robotic/laparoscopic prostatectomy was also discussed.   He will be scheduled for a left nerve sparing and potentially partial right nerve sparing robotic prostatectomy and pelvic lymphadenectomy.  Cc: Dr. Jerilee Field Dr. Assunta Found    SignaturesElectronically signed by : Heloise Purpura, M.D.; Jul 03 2012  5:44PM

## 2012-07-16 ENCOUNTER — Inpatient Hospital Stay (HOSPITAL_COMMUNITY): Payer: 59 | Admitting: Anesthesiology

## 2012-07-16 ENCOUNTER — Inpatient Hospital Stay (HOSPITAL_COMMUNITY)
Admission: RE | Admit: 2012-07-16 | Discharge: 2012-07-17 | DRG: 708 | Disposition: A | Discharge status: Home / Self Care | Payer: 59 | Source: Ambulatory Visit | Attending: Urology | Admitting: Urology

## 2012-07-16 ENCOUNTER — Encounter (HOSPITAL_COMMUNITY)
Admission: RE | Disposition: A | Discharge status: Home / Self Care | Payer: Self-pay | Source: Ambulatory Visit | Attending: Urology

## 2012-07-16 ENCOUNTER — Encounter (HOSPITAL_COMMUNITY): Payer: Self-pay | Admitting: Anesthesiology

## 2012-07-16 ENCOUNTER — Encounter (HOSPITAL_COMMUNITY): Payer: Self-pay | Admitting: *Deleted

## 2012-07-16 DIAGNOSIS — K660 Peritoneal adhesions (postprocedural) (postinfection): Secondary | ICD-10-CM | POA: Diagnosis present

## 2012-07-16 DIAGNOSIS — I10 Essential (primary) hypertension: Secondary | ICD-10-CM | POA: Diagnosis present

## 2012-07-16 DIAGNOSIS — G4730 Sleep apnea, unspecified: Secondary | ICD-10-CM | POA: Diagnosis present

## 2012-07-16 DIAGNOSIS — C61 Malignant neoplasm of prostate: Principal | ICD-10-CM | POA: Diagnosis present

## 2012-07-16 DIAGNOSIS — Z01812 Encounter for preprocedural laboratory examination: Secondary | ICD-10-CM

## 2012-07-16 HISTORY — PX: ROBOT ASSISTED LAPAROSCOPIC RADICAL PROSTATECTOMY: SHX5141

## 2012-07-16 LAB — TYPE AND SCREEN: Antibody Screen: NEGATIVE

## 2012-07-16 SURGERY — ROBOTIC ASSISTED LAPAROSCOPIC RADICAL PROSTATECTOMY LEVEL 3
Anesthesia: General | Wound class: Clean Contaminated

## 2012-07-16 MED ORDER — SODIUM CHLORIDE 0.9 % IV BOLUS (SEPSIS)
1000.0000 mL | Freq: Once | INTRAVENOUS | Status: AC
  Administered 2012-07-16: 1000 mL via INTRAVENOUS

## 2012-07-16 MED ORDER — HYDROMORPHONE HCL PF 1 MG/ML IJ SOLN
0.2500 mg | INTRAMUSCULAR | Status: DC | PRN
  Administered 2012-07-16: 0.5 mg via INTRAVENOUS

## 2012-07-16 MED ORDER — KETOROLAC TROMETHAMINE 15 MG/ML IJ SOLN
INTRAMUSCULAR | Status: AC
  Filled 2012-07-16: qty 1

## 2012-07-16 MED ORDER — HYDROMORPHONE HCL PF 1 MG/ML IJ SOLN
INTRAMUSCULAR | Status: DC | PRN
  Administered 2012-07-16: 1 mg via INTRAVENOUS

## 2012-07-16 MED ORDER — CIPROFLOXACIN HCL 500 MG PO TABS: 500.0000 mg | ORAL_TABLET | Freq: Two times a day (BID) | ORAL | Status: AC

## 2012-07-16 MED ORDER — LOSARTAN POTASSIUM-HCTZ 100-12.5 MG PO TABS: 1.0000 | ORAL_TABLET | Freq: Every day | ORAL | Status: DC

## 2012-07-16 MED ORDER — KCL IN DEXTROSE-NACL 20-5-0.45 MEQ/L-%-% IV SOLN
INTRAVENOUS | Status: DC
  Administered 2012-07-16 – 2012-07-17 (×3): via INTRAVENOUS
  Filled 2012-07-16 (×4): qty 1000

## 2012-07-16 MED ORDER — ALBUTEROL SULFATE HFA 108 (90 BASE) MCG/ACT IN AERS
2.0000 | INHALATION_SPRAY | Freq: Four times a day (QID) | RESPIRATORY_TRACT | Status: DC | PRN
  Filled 2012-07-16: qty 6.7

## 2012-07-16 MED ORDER — ONDANSETRON HCL 4 MG/2ML IJ SOLN: 4.0000 mg | INTRAMUSCULAR | Status: DC | PRN

## 2012-07-16 MED ORDER — ROCURONIUM BROMIDE 100 MG/10ML IV SOLN
INTRAVENOUS | Status: DC | PRN
  Administered 2012-07-16 (×2): 15 mg via INTRAVENOUS
  Administered 2012-07-16: 20 mg via INTRAVENOUS
  Administered 2012-07-16: 50 mg via INTRAVENOUS

## 2012-07-16 MED ORDER — HYDROCODONE-ACETAMINOPHEN 5-325 MG PO TABS: 1.0000 | ORAL_TABLET | Freq: Four times a day (QID) | ORAL | Status: AC | PRN

## 2012-07-16 MED ORDER — ATORVASTATIN CALCIUM 10 MG PO TABS
10.0000 mg | ORAL_TABLET | Freq: Every day | ORAL | Status: DC
  Administered 2012-07-16: 10 mg via ORAL
  Filled 2012-07-16 (×2): qty 1

## 2012-07-16 MED ORDER — ACETAMINOPHEN 10 MG/ML IV SOLN
INTRAVENOUS | Status: DC | PRN
  Administered 2012-07-16: 1000 mg via INTRAVENOUS

## 2012-07-16 MED ORDER — INDIGOTINDISULFONATE SODIUM 8 MG/ML IJ SOLN
INTRAMUSCULAR | Status: DC | PRN
  Administered 2012-07-16 (×2): 5 mL via INTRAVENOUS

## 2012-07-16 MED ORDER — DIPHENHYDRAMINE HCL 50 MG/ML IJ SOLN: 12.5000 mg | Freq: Four times a day (QID) | INTRAMUSCULAR | Status: DC | PRN

## 2012-07-16 MED ORDER — LACTATED RINGERS IV SOLN
INTRAVENOUS | Status: DC | PRN
  Administered 2012-07-16: 11:00:00

## 2012-07-16 MED ORDER — KETOROLAC TROMETHAMINE 15 MG/ML IJ SOLN
15.0000 mg | Freq: Four times a day (QID) | INTRAMUSCULAR | Status: DC
  Administered 2012-07-16 – 2012-07-17 (×5): 15 mg via INTRAVENOUS
  Filled 2012-07-16 (×7): qty 1

## 2012-07-16 MED ORDER — DEXAMETHASONE SODIUM PHOSPHATE 10 MG/ML IJ SOLN
INTRAMUSCULAR | Status: DC | PRN
  Administered 2012-07-16: 10 mg via INTRAVENOUS

## 2012-07-16 MED ORDER — LOSARTAN POTASSIUM 50 MG PO TABS
100.0000 mg | ORAL_TABLET | Freq: Every day | ORAL | Status: DC
  Administered 2012-07-17: 100 mg via ORAL
  Filled 2012-07-16: qty 2

## 2012-07-16 MED ORDER — NEOMYCIN-POLYMYXIN-HC 3.5-10000-1 OT SOLN
4.0000 [drp] | Freq: Four times a day (QID) | OTIC | Status: DC
  Filled 2012-07-16: qty 10

## 2012-07-16 MED ORDER — LACTATED RINGERS IV SOLN
INTRAVENOUS | Status: DC
Start: 2012-07-16 — End: 2012-07-16
  Administered 2012-07-16: 1000 mL via INTRAVENOUS

## 2012-07-16 MED ORDER — STERILE WATER FOR IRRIGATION IR SOLN
Status: DC | PRN
  Administered 2012-07-16: 3000 mL

## 2012-07-16 MED ORDER — HYDROCHLOROTHIAZIDE 12.5 MG PO CAPS
12.5000 mg | ORAL_CAPSULE | Freq: Every day | ORAL | Status: DC
  Administered 2012-07-17: 12.5 mg via ORAL
  Filled 2012-07-16: qty 1

## 2012-07-16 MED ORDER — BUPIVACAINE-EPINEPHRINE 0.25% -1:200000 IJ SOLN
INTRAMUSCULAR | Status: DC | PRN
  Administered 2012-07-16: 27 mL

## 2012-07-16 MED ORDER — HYDROMORPHONE HCL PF 1 MG/ML IJ SOLN
INTRAMUSCULAR | Status: AC
  Filled 2012-07-16: qty 1

## 2012-07-16 MED ORDER — MORPHINE SULFATE 2 MG/ML IJ SOLN
2.0000 mg | INTRAMUSCULAR | Status: DC | PRN
  Administered 2012-07-16 – 2012-07-17 (×3): 2 mg via INTRAVENOUS
  Filled 2012-07-16 (×3): qty 1

## 2012-07-16 MED ORDER — GLYCOPYRROLATE 0.2 MG/ML IJ SOLN
INTRAMUSCULAR | Status: DC | PRN
  Administered 2012-07-16: .7 mg via INTRAVENOUS

## 2012-07-16 MED ORDER — FENTANYL CITRATE 0.05 MG/ML IJ SOLN
INTRAMUSCULAR | Status: DC | PRN
  Administered 2012-07-16 (×4): 50 ug via INTRAVENOUS
  Administered 2012-07-16: 100 ug via INTRAVENOUS
  Administered 2012-07-16: 50 ug via INTRAVENOUS

## 2012-07-16 MED ORDER — SODIUM CHLORIDE 0.9 % IR SOLN
Status: DC | PRN
  Administered 2012-07-16: 300 mL via INTRAVESICAL

## 2012-07-16 MED ORDER — PROPOFOL 10 MG/ML IV EMUL
INTRAVENOUS | Status: DC | PRN
  Administered 2012-07-16: 200 mg via INTRAVENOUS

## 2012-07-16 MED ORDER — NEOSTIGMINE METHYLSULFATE 1 MG/ML IJ SOLN
INTRAMUSCULAR | Status: DC | PRN
  Administered 2012-07-16: 5 mg via INTRAVENOUS

## 2012-07-16 MED ORDER — DOCUSATE SODIUM 100 MG PO CAPS
100.0000 mg | ORAL_CAPSULE | Freq: Two times a day (BID) | ORAL | Status: DC
  Administered 2012-07-16 – 2012-07-17 (×2): 100 mg via ORAL
  Filled 2012-07-16 (×3): qty 1

## 2012-07-16 MED ORDER — BELLADONNA ALKALOIDS-OPIUM 16.2-60 MG RE SUPP: 1.0000 | Freq: Four times a day (QID) | RECTAL | Status: DC | PRN

## 2012-07-16 MED ORDER — MIDAZOLAM HCL 5 MG/5ML IJ SOLN
INTRAMUSCULAR | Status: DC | PRN
  Administered 2012-07-16: 2 mg via INTRAVENOUS

## 2012-07-16 MED ORDER — CEFAZOLIN SODIUM-DEXTROSE 2-3 GM-% IV SOLR
2.0000 g | INTRAVENOUS | Status: AC
  Administered 2012-07-16: 2 g via INTRAVENOUS

## 2012-07-16 MED ORDER — ONDANSETRON HCL 4 MG/2ML IJ SOLN
INTRAMUSCULAR | Status: DC | PRN
  Administered 2012-07-16: 4 mg via INTRAVENOUS

## 2012-07-16 MED ORDER — LACTATED RINGERS IV SOLN: INTRAVENOUS | Status: DC

## 2012-07-16 MED ORDER — ACETAMINOPHEN 325 MG PO TABS: 650.0000 mg | ORAL_TABLET | ORAL | Status: DC | PRN

## 2012-07-16 MED ORDER — CEFAZOLIN SODIUM 1-5 GM-% IV SOLN
1.0000 g | Freq: Three times a day (TID) | INTRAVENOUS | Status: AC
  Administered 2012-07-16 – 2012-07-17 (×2): 1 g via INTRAVENOUS
  Filled 2012-07-16 (×2): qty 50

## 2012-07-16 MED ORDER — DIPHENHYDRAMINE HCL 12.5 MG/5ML PO ELIX: 12.5000 mg | ORAL_SOLUTION | Freq: Four times a day (QID) | ORAL | Status: DC | PRN

## 2012-07-16 SURGICAL SUPPLY — 42 items
CANISTER SUCTION 2500CC (MISCELLANEOUS) ×4 IMPLANT
CATH FOLEY 2WAY SLVR  5CC 18FR (CATHETERS) ×1
CATH FOLEY 2WAY SLVR 5CC 18FR (CATHETERS) ×3 IMPLANT
CATH ROBINSON RED A/P 8FR (CATHETERS) ×4 IMPLANT
CATH TIEMANN FOLEY 18FR 5CC (CATHETERS) ×4 IMPLANT
CHLORAPREP W/TINT 26ML (MISCELLANEOUS) ×4 IMPLANT
CLIP LIGATING HEM O LOK PURPLE (MISCELLANEOUS) ×8 IMPLANT
CLOTH BEACON ORANGE TIMEOUT ST (SAFETY) ×4 IMPLANT
COVER SURGICAL LIGHT HANDLE (MISCELLANEOUS) ×4 IMPLANT
COVER TIP SHEARS 8 DVNC (MISCELLANEOUS) ×3 IMPLANT
COVER TIP SHEARS 8MM DA VINCI (MISCELLANEOUS) ×1
CUTTER ECHEON FLEX ENDO 45 340 (ENDOMECHANICALS) ×4 IMPLANT
DECANTER SPIKE VIAL GLASS SM (MISCELLANEOUS) ×4 IMPLANT
DRAPE CAMERA CLOSED 9X96 (DRAPES) ×4 IMPLANT
DRAPE SURG IRRIG POUCH 19X23 (DRAPES) ×4 IMPLANT
DRSG TEGADERM 2-3/8X2-3/4 SM (GAUZE/BANDAGES/DRESSINGS) ×24 IMPLANT
DRSG TEGADERM 4X4.75 (GAUZE/BANDAGES/DRESSINGS) ×4 IMPLANT
DRSG TEGADERM 6X8 (GAUZE/BANDAGES/DRESSINGS) ×8 IMPLANT
ELECT REM PT RETURN 9FT ADLT (ELECTROSURGICAL) ×4
ELECTRODE REM PT RTRN 9FT ADLT (ELECTROSURGICAL) ×3 IMPLANT
GLOVE BIO SURGEON STRL SZ 6.5 (GLOVE) ×4 IMPLANT
GLOVE BIOGEL M STRL SZ7.5 (GLOVE) ×8 IMPLANT
GOWN STRL NON-REIN LRG LVL3 (GOWN DISPOSABLE) ×28 IMPLANT
HOLDER FOLEY CATH W/STRAP (MISCELLANEOUS) ×4 IMPLANT
IV LACTATED RINGERS 1000ML (IV SOLUTION) IMPLANT
KIT ACCESSORY DA VINCI DISP (KITS) ×1
KIT ACCESSORY DVNC DISP (KITS) ×3 IMPLANT
NDL SAFETY ECLIPSE 18X1.5 (NEEDLE) ×3 IMPLANT
NEEDLE HYPO 18GX1.5 SHARP (NEEDLE) ×2
PACK ROBOT UROLOGY CUSTOM (CUSTOM PROCEDURE TRAY) ×4 IMPLANT
RELOAD GREEN ECHELON 45 (STAPLE) ×4 IMPLANT
SCISSORS LAP 5X35 DISP (ENDOMECHANICALS) ×4 IMPLANT
SET TUBE IRRIG SUCTION NO TIP (IRRIGATION / IRRIGATOR) ×4 IMPLANT
SOLUTION ELECTROLUBE (MISCELLANEOUS) ×4 IMPLANT
SPONGE GAUZE 4X4 12PLY (GAUZE/BANDAGES/DRESSINGS) ×4 IMPLANT
SUT ETHILON 3 0 PS 1 (SUTURE) ×4 IMPLANT
SUT VICRYL 0 UR6 27IN ABS (SUTURE) ×8 IMPLANT
SYR 27GX1/2 1ML LL SAFETY (SYRINGE) ×4 IMPLANT
TOWEL OR 17X26 10 PK STRL BLUE (TOWEL DISPOSABLE) ×4 IMPLANT
TOWEL OR NON WOVEN STRL DISP B (DISPOSABLE) ×4 IMPLANT
TROCAR BLADELESS OPT 5 100 (ENDOMECHANICALS) ×4 IMPLANT
WATER STERILE IRR 1500ML POUR (IV SOLUTION) IMPLANT

## 2012-07-16 NOTE — Anesthesia Preprocedure Evaluation (Addendum)
Anesthesia Evaluation  Patient identified by MRN, date of birth, ID band Patient awake    Reviewed: Allergy & Precautions, H&P , NPO status , Patient's Chart, lab work & pertinent test results  Airway Mallampati: II TM Distance: >3 FB Neck ROM: full    Dental No notable dental hx. (+) Teeth Intact and Dental Advisory Given   Pulmonary neg pulmonary ROS, asthma , sleep apnea ,  Stop bang 5 breath sounds clear to auscultation  Pulmonary exam normal       Cardiovascular Exercise Tolerance: Good hypertension, Pt. on medications negative cardio ROS  Rhythm:regular Rate:Normal     Neuro/Psych negative neurological ROS  negative psych ROS   GI/Hepatic negative GI ROS, Neg liver ROS,   Endo/Other  negative endocrine ROS  Renal/GU negative Renal ROS  negative genitourinary   Musculoskeletal   Abdominal   Peds  Hematology negative hematology ROS (+)   Anesthesia Other Findings   Reproductive/Obstetrics negative OB ROS                           Anesthesia Physical Anesthesia Plan  ASA: III  Anesthesia Plan: General   Post-op Pain Management:    Induction: Intravenous  Airway Management Planned: Oral ETT  Additional Equipment:   Intra-op Plan:   Post-operative Plan: Extubation in OR  Informed Consent: I have reviewed the patients History and Physical, chart, labs and discussed the procedure including the risks, benefits and alternatives for the proposed anesthesia with the patient or authorized representative who has indicated his/her understanding and acceptance.   Dental Advisory Given  Plan Discussed with: CRNA and Surgeon  Anesthesia Plan Comments:        Anesthesia Quick Evaluation

## 2012-07-16 NOTE — Anesthesia Postprocedure Evaluation (Signed)
  Anesthesia Post-op Note  Patient: Corey Carson  Procedure(s) Performed: Procedure(s) (LRB): ROBOTIC ASSISTED LAPAROSCOPIC RADICAL PROSTATECTOMY LEVEL 3 (N/A) LYMPHADENECTOMY (Bilateral) LAPAROSCOPIC LYSIS OF ADHESIONS ()  Patient Location: PACU  Anesthesia Type: General  Level of Consciousness: awake and alert   Airway and Oxygen Therapy: Patient Spontanous Breathing  Post-op Pain: mild  Post-op Assessment: Post-op Vital signs reviewed, Patient's Cardiovascular Status Stable, Respiratory Function Stable, Patent Airway and No signs of Nausea or vomiting  Post-op Vital Signs: stable  Complications: No apparent anesthesia complications

## 2012-07-16 NOTE — Interval H&P Note (Signed)
History and Physical Interval Note:  07/16/2012 10:01 AM  Wilder Glade  has presented today for surgery, with the diagnosis of PROSTATE CANCER  The various methods of treatment have been discussed with the patient and family. After consideration of risks, benefits and other options for treatment, the patient has consented to  Procedure(s) (LRB): ROBOTIC ASSISTED LAPAROSCOPIC RADICAL PROSTATECTOMY LEVEL 3 (N/A) LYMPHADENECTOMY (Bilateral) as a surgical intervention .  The patient's history has been reviewed, patient examined, no change in status, stable for surgery.  I have reviewed the patient's chart and labs.  Questions were answered to the patient's satisfaction.     Kaymon Denomme,LES

## 2012-07-16 NOTE — Transfer of Care (Signed)
Immediate Anesthesia Transfer of Care Note  Patient: Wilder Glade  Procedure(s) Performed: Procedure(s) (LRB): ROBOTIC ASSISTED LAPAROSCOPIC RADICAL PROSTATECTOMY LEVEL 3 (N/A) LYMPHADENECTOMY (Bilateral) LAPAROSCOPIC LYSIS OF ADHESIONS ()  Patient Location: PACU  Anesthesia Type: General  Level of Consciousness: awake, sedated and patient cooperative  Airway & Oxygen Therapy: Patient Spontanous Breathing and Patient connected to face mask oxygen  Post-op Assessment: Report given to PACU RN and Post -op Vital signs reviewed and stable  Post vital signs: Reviewed and stable  Complications: No apparent anesthesia complications

## 2012-07-16 NOTE — Progress Notes (Signed)
Patient ID: Corey Carson, male   DOB: February 18, 1956, 56 y.o.   MRN: [id number]  Post-op note  Subjective: The patient is doing well.   Objective: Vital signs in last 24 hours: Temp:  [97.7 F (36.5 C)-98.6 F (37 C)] 97.7 F (36.5 C) (08/12 1549) Pulse Rate:  [74-84] 83  (08/12 1549) Resp:  [10-20] 14  (08/12 1549) BP: (131-185)/(80-98) 175/90 mmHg (08/12 1549) SpO2:  [92 %-100 %] 95 % (08/12 1549) Weight:  [118.389 kg (261 lb)] 118.389 kg (261 lb) (08/12 1549)  Intake/Output from previous day:   Intake/Output this shift: Total I/O In: 2150 [I.V.:1150; IV Piggyback:1000] Out: 420 [Urine:120; Drains:50; Blood:250]  Physical Exam:  General: Alert and oriented. Abdomen: Soft, Nondistended. Incisions: Clean and dry.  Lab Results:  Basename 07/16/12 1420  HGB 12.4*  HCT 37.1*    Assessment/Plan: POD#0   1) Continue to monitor   Moody Bruins. MD   LOS: 0 days   Kamon Fahr,LES 07/16/2012, 4:27 PM

## 2012-07-16 NOTE — Op Note (Signed)
Preoperative diagnosis: Clinically localized adenocarcinoma of the prostate (clinical stage T1c Nx Mx)  Postoperative diagnosis: 1) Clinically localized adenocarcinoma of the prostate (clinical stage T1c Nx Mx) 2) Abdominal adhesions  Procedure:  1. Robotic assisted laparoscopic radical prostatectomy (bilateral nerve sparing) 2. Bilateral robotic assisted laparoscopic pelvic lymphadenectomy 3. Laparoscopic adhesiolysis  Surgeon: Moody Bruins. M.D.  Assistant: Pecola Leisure, PA-C  Anesthesia: General  Complications: None  EBL: 250 mL  IVF:  800 mL crystalloid  Specimens: 1. Prostate and seminal vesicles 2. Right pelvic lymph nodes 3. Left pelvic lymph nodes  Disposition of specimens: Pathology  Drains: 1. 20 Fr coude catheter 2. # 19 Blake pelvic drain  Indication: Corey Carson is a 56 y.o. year old patient with clinically localized prostate cancer.  After a thorough review of the management options for treatment of prostate cancer, he elected to proceed with surgical therapy and the above procedure(s).  We have discussed the potential benefits and risks of the procedure, side effects of the proposed treatment, the likelihood of the patient achieving the goals of the procedure, and any potential problems that might occur during the procedure or recuperation. Informed consent has been obtained.  Description of procedure:  The patient was taken to the operating room and a general anesthetic was administered. He was given preoperative antibiotics, placed in the dorsal lithotomy position, and prepped and draped in the usual sterile fashion. Next a preoperative timeout was performed. A urethral catheter was placed into the bladder and a site was selected near the umbilicus for placement of the camera port. This was placed using a standard open Hassan technique which allowed entry into the peritoneal cavity under direct vision and without difficulty.   A 12 mm port was  placed and a pneumoperitoneum established. The camera was then used to inspect the abdomen and there was noted to be extensive adhesions between the small bowel and right abdominal wall. 8 mm robotic ports were placed in the left lower quadrant and far left lower quadrant. An additional 5 mm port was placed in the left lower quadrant for a 5mm camera. Laparoscopic scissors were then used to take down the adhesions carefully.  There were extensive adhesions and this took an extensive time.  Overall 55 minutes were spent on the adhesiolysis which represented more than one third of the total operating time.  The remaining abdominal ports were then placed. 8 mm robotic ports were placed in the right lower quadrant.  A 5 mm port was placed in the right upper quadrant and a 12 mm port was placed in the right lateral abdominal wall for laparoscopic assistance. All ports were placed under direct vision without difficulty. The surgical cart was then docked.   Utilizing the cautery scissors, the bladder was reflected posteriorly allowing entry into the space of Retzius and identification of the endopelvic fascia and prostate. The periprostatic fat was then removed from the prostate allowing full exposure of the endopelvic fascia. The endopelvic fascia was then incised from the apex back to the base of the prostate bilaterally and the underlying levator muscle fibers were swept laterally off the prostate thereby isolating the dorsal venous complex. The dorsal vein was then stapled and divided with a 45 mm Flex Echelon stapler. Attention then turned to the bladder neck which was divided anteriorly thereby allowing entry into the bladder and exposure of the urethral catheter. The catheter balloon was deflated and the catheter was brought into the operative field and used to retract  the prostate anteriorly. The posterior bladder neck was then examined and was divided allowing further dissection between the bladder and prostate  posteriorly until the vasa deferentia and seminal vessels were identified. The vasa deferentia were isolated, divided, and lifted anteriorly. The seminal vesicles were dissected down to their tips with care to control the seminal vascular arterial blood supply. These structures were then lifted anteriorly and the space between Denonvillier's fascia and the anterior rectum was developed with a combination of sharp and blunt dissection. This isolated the vascular pedicles of the prostate.  The lateral prostatic fascia was then sharply incised allowing release of the neurovascular bundles bilaterally. The vascular pedicles of the prostate were then ligated with Weck clips between the prostate and neurovascular bundles and divided with sharp cold scissor dissection resulting in neurovascular bundle preservation. The neurovascular bundles were then separated off the apex of the prostate and urethra bilaterally.  The urethra was then sharply transected allowing the prostate specimen to be disarticulated. The pelvis was copiously irrigated and hemostasis was ensured. There was no evidence for rectal injury.  Attention then turned to the right pelvic sidewall. The fibrofatty tissue between the external iliac vein, confluence of the iliac vessels, hypogastric artery, and Cooper's ligament was dissected free from the pelvic sidewall with care to preserve the obturator nerve. Weck clips were used for lymphostasis and hemostasis. An identical procedure was performed on the contralateral side and the lymphatic packets were removed for permanent pathologic analysis.  Attention then turned to the urethral anastomosis. His bladder neck required reconstruction.  A 3-0 vicryl was used to place figure of eight sutures at the 5 and 7 o'clock positions of the bladder neck. A 2-0 Vicryl slip knot was placed between Denonvillier's fascia, the posterior bladder neck, and the posterior urethra to reapproximate these structures. A  double-armed 3-0 Monocryl suture was then used to perform a 360 running tension-free anastomosis between the bladder neck and urethra. A new urethral catheter was then placed into the bladder and irrigated. There were no blood clots within the bladder and the anastomosis appeared to be watertight. A #19 Blake drain was then brought through the left lateral 8 mm port site and positioned appropriately within the pelvis. It was secured to the skin with a nylon suture. The surgical cart was then undocked. The right lateral 12 mm port site was closed at the fascial level with a 0 Vicryl suture placed laparoscopically. All remaining ports were then removed under direct vision. The prostate specimen was removed intact within the Endopouch retrieval bag via the periumbilical camera port site. This fascial opening was closed with two running 0 Vicryl sutures. 0.25% Marcaine was then injected into all port sites and all incisions were reapproximated at the skin level with staples. Sterile dressings were applied. The patient appeared to tolerate the procedure well and without complications. The patient was able to be extubated and transferred to the recovery unit in satisfactory condition.   Moody Bruins MD

## 2012-07-17 ENCOUNTER — Encounter (HOSPITAL_COMMUNITY): Payer: Self-pay | Admitting: Urology

## 2012-07-17 MED ORDER — HYDROCODONE-ACETAMINOPHEN 5-325 MG PO TABS: 1.0000 | ORAL_TABLET | Freq: Four times a day (QID) | ORAL | Status: DC | PRN

## 2012-07-17 MED ORDER — BISACODYL 10 MG RE SUPP
10.0000 mg | Freq: Once | RECTAL | Status: AC
  Administered 2012-07-17: 10 mg via RECTAL
  Filled 2012-07-17: qty 1

## 2012-07-17 NOTE — Discharge Summary (Signed)
  Date of admission: 07/16/2012  Date of discharge: 07/17/2012  Admission diagnosis: Prostate Cancer  Discharge diagnosis: Prostate Cancer  History and Physical: For full details, please see admission history and physical. Briefly, Corey Carson is a 56 y.o. gentleman with localized prostate cancer.  After discussing management/treatment options, he elected to proceed with surgical treatment.  Hospital Course: KACE HARTJE was taken to the operating room on 07/16/2012 and underwent a robotic assisted laparoscopic radical prostatectomy. He tolerated this procedure well and without complications. Postoperatively, he was able to be transferred to a regular hospital room following recovery from anesthesia.  He was able to begin ambulating the night of surgery. He remained hemodynamically stable overnight.  He had excellent urine output with appropriately minimal output from his pelvic drain and his pelvic drain was removed on POD #1.  He was transitioned to oral pain medication, tolerated a clear liquid diet, and had met all discharge criteria and was able to be discharged home later on POD#1.  Laboratory values:  Basename 07/17/12 0457 07/16/12 1420  HGB 11.4* 12.4*  HCT 33.6* 37.1*    Disposition: Home  Discharge instruction: He was instructed to be ambulatory but to refrain from heavy lifting, strenuous activity, or driving. He was instructed on urethral catheter care.  Discharge medications:   Medication List  As of 07/17/2012  1:44 PM   START taking these medications         ciprofloxacin 500 MG tablet   Commonly known as: CIPRO   Take 1 tablet (500 mg total) by mouth 2 (two) times daily. Start day prior to office visit for foley removal      HYDROcodone-acetaminophen 5-325 MG per tablet   Commonly known as: NORCO/VICODIN   Take 1-2 tablets by mouth every 6 (six) hours as needed for pain.         CONTINUE taking these medications         * albuterol 108 (90 BASE) MCG/ACT  inhaler   Commonly known as: PROVENTIL HFA;VENTOLIN HFA      * albuterol (2.5 MG/3ML) 0.083% nebulizer solution   Commonly known as: PROVENTIL      atorvastatin 10 MG tablet   Commonly known as: LIPITOR      losartan-hydrochlorothiazide 100-12.5 MG per tablet   Commonly known as: HYZAAR      neomycin-polymyxin-hydrocortisone otic solution   Commonly known as: CORTISPORIN     * Notice: This list has 2 medication(s) that are the same as other medications prescribed for you. Read the directions carefully, and ask your doctor or other care provider to review them with you.       STOP taking these medications         aspirin EC 81 MG tablet          Where to get your medications    These are the prescriptions that you need to pick up.   You may get these medications from any pharmacy.         ciprofloxacin 500 MG tablet   HYDROcodone-acetaminophen 5-325 MG per tablet            Followup: He will followup in 1 week for catheter removal and to discuss his surgical pathology results.

## 2012-07-17 NOTE — Progress Notes (Signed)
Patient ID: Corey Carson, male   DOB: 1956-01-26, 56 y.o.   MRN: [id number]  1 Day Post-Op Subjective: The patient is doing well.  No nausea or vomiting. Pain is adequately controlled.  Objective: Vital signs in last 24 hours: Temp:  [97.6 F (36.4 C)-98.7 F (37.1 C)] 98.1 F (36.7 C) (08/13 0542) Pulse Rate:  [74-97] 97  (08/13 0542) Resp:  [10-20] 16  (08/13 0542) BP: (131-185)/(80-98) 165/91 mmHg (08/13 0542) SpO2:  [92 %-100 %] 95 % (08/13 0542) Weight:  [118.389 kg (261 lb)] 118.389 kg (261 lb) (08/12 1549)  Intake/Output from previous day: 08/12 0701 - 08/13 0700 In: 4425 [I.V.:3325; IV Piggyback:1100] Out: 1425 [Urine:1070; Drains:105; Blood:250] Intake/Output this shift:    Physical Exam:  General: Alert and oriented. CV: RRR Lungs: Clear bilaterally. GI: Soft, Nondistended. Incisions: Dressings intact. Urine: Clear Extremities: Nontender, no erythema, no edema.  Lab Results:  Basename 07/17/12 0457 07/16/12 1420  HGB 11.4* 12.4*  HCT 33.6* 37.1*      Assessment/Plan: POD# 1 s/p robotic prostatectomy.  1) SL IVF 2) Ambulate, Incentive spirometry 3) Transition to oral pain medication 4) Dulcolax suppository 5) D/C pelvic drain 6) Plan for likely discharge later today   Moody Bruins. MD   LOS: 1 day   Haydn Hutsell,LES 07/17/2012, 7:33 AM

## 2012-07-17 NOTE — Progress Notes (Signed)
   CARE MANAGEMENT NOTE 07/17/2012  Patient:  Corey Carson,Corey Carson   Account Number:  [id number]  Date Initiated:  07/17/2012  Documentation initiated by:  Jiles Crocker  Subjective/Objective Assessment:   ADMITTED FOR Robotic assisted laparoscopic radical prostatectomy (bilateral nerve sparing)     Action/Plan:   PCP: Dr. Assunta Found  LIVES AT HOME WITH SPOUSE   Anticipated DC Date:  07/19/2012   Anticipated DC Plan:  HOME/SELF CARE           Status of service:  In process, will continue to follow Medicare Important Message given?  NA - LOS <3 / Initial given by admissions (If response is "NO", the following Medicare IM given date fields will be blank)  Per UR Regulation:  Reviewed for med. necessity/level of care/duration of stay  Comments:  07/17/2012- B Kelby Adell RN, BSN, MHA

## 2012-07-20 ENCOUNTER — Emergency Department (HOSPITAL_COMMUNITY)
Admission: EM | Admit: 2012-07-20 | Discharge: 2012-07-20 | Disposition: A | Discharge status: Home / Self Care | Payer: 59 | Attending: Emergency Medicine | Admitting: Emergency Medicine

## 2012-07-20 ENCOUNTER — Emergency Department (HOSPITAL_COMMUNITY): Payer: 59

## 2012-07-20 ENCOUNTER — Encounter (HOSPITAL_COMMUNITY): Payer: Self-pay | Admitting: Emergency Medicine

## 2012-07-20 DIAGNOSIS — Z9089 Acquired absence of other organs: Secondary | ICD-10-CM | POA: Insufficient documentation

## 2012-07-20 DIAGNOSIS — I10 Essential (primary) hypertension: Secondary | ICD-10-CM | POA: Insufficient documentation

## 2012-07-20 DIAGNOSIS — J45909 Unspecified asthma, uncomplicated: Secondary | ICD-10-CM | POA: Insufficient documentation

## 2012-07-20 DIAGNOSIS — E7800 Pure hypercholesterolemia, unspecified: Secondary | ICD-10-CM | POA: Insufficient documentation

## 2012-07-20 DIAGNOSIS — Z79899 Other long term (current) drug therapy: Secondary | ICD-10-CM | POA: Insufficient documentation

## 2012-07-20 DIAGNOSIS — R079 Chest pain, unspecified: Secondary | ICD-10-CM

## 2012-07-20 LAB — CBC WITH DIFFERENTIAL/PLATELET
Basophils Absolute: 0 10*3/uL (ref 0.0–0.1)
Basophils Relative: 0 % (ref 0–1)
Eosinophils Absolute: 0.3 10*3/uL (ref 0.0–0.7)
Eosinophils Relative: 3 % (ref 0–5)
HCT: 35.1 % — ABNORMAL LOW (ref 39.0–52.0)
Hemoglobin: 12 g/dL — ABNORMAL LOW (ref 13.0–17.0)
Lymphocytes Relative: 21 % (ref 12–46)
Lymphs Abs: 2.3 10*3/uL (ref 0.7–4.0)
MCH: 28.1 pg (ref 26.0–34.0)
MCHC: 34.2 g/dL (ref 30.0–36.0)
MCV: 82.2 fL (ref 78.0–100.0)
Monocytes Absolute: 0.9 10*3/uL (ref 0.1–1.0)
Monocytes Relative: 8 % (ref 3–12)
Neutro Abs: 7.5 10*3/uL (ref 1.7–7.7)
Neutrophils Relative %: 68 % (ref 43–77)
Platelets: 318 10*3/uL (ref 150–400)
RBC: 4.27 MIL/uL (ref 4.22–5.81)
RDW: 12.5 % (ref 11.5–15.5)
WBC: 10.9 10*3/uL — ABNORMAL HIGH (ref 4.0–10.5)

## 2012-07-20 LAB — BASIC METABOLIC PANEL
BUN: 10 mg/dL (ref 6–23)
CO2: 28 mEq/L (ref 19–32)
Calcium: 9.7 mg/dL (ref 8.4–10.5)
Chloride: 94 mEq/L — ABNORMAL LOW (ref 96–112)
Creatinine, Ser: 0.67 mg/dL (ref 0.50–1.35)
GFR calc Af Amer: >90 mL/min (ref >90)
GFR calc non Af Amer: >90 mL/min (ref >90)
Glucose, Bld: 130 mg/dL — ABNORMAL HIGH (ref 70–99)
Potassium: 3.9 mEq/L (ref 3.5–5.1)
Sodium: 133 mEq/L — ABNORMAL LOW (ref 135–145)

## 2012-07-20 LAB — TROPONIN I: Troponin I: <0.3 ng/mL (ref <0.30)

## 2012-07-20 MED ORDER — HYDROMORPHONE HCL PF 1 MG/ML IJ SOLN
1.0000 mg | Freq: Once | INTRAMUSCULAR | Status: AC
  Administered 2012-07-20: 1 mg via INTRAMUSCULAR

## 2012-07-20 MED ORDER — ONDANSETRON HCL 4 MG PO TABS
4.0000 mg | ORAL_TABLET | Freq: Once | ORAL | Status: AC
  Administered 2012-07-20: 4 mg via ORAL
  Filled 2012-07-20: qty 1

## 2012-07-20 MED ORDER — ONDANSETRON HCL 4 MG/2ML IJ SOLN
4.0000 mg | Freq: Once | INTRAMUSCULAR | Status: DC
  Filled 2012-07-20: qty 2

## 2012-07-20 MED ORDER — HYDROMORPHONE HCL PF 1 MG/ML IJ SOLN
1.0000 mg | Freq: Once | INTRAMUSCULAR | Status: DC
  Filled 2012-07-20: qty 1

## 2012-07-20 NOTE — ED Notes (Signed)
EKG completed and pt placed on cardiac monitor

## 2012-07-20 NOTE — ED Notes (Signed)
Pt alert & oriented x4. Patient given discharge instructions, paperwork & prescription(s). Patient instructed to stop at the registration desk to finish any additional paperwork. Patient verbalized understanding. Pt left department w/ no further questions. 

## 2012-07-20 NOTE — ED Provider Notes (Signed)
History    56yM with CP. L upper chest. Onset 2d ago while reaching out with L arm to pull self into bed. Mild constant ache at rest and worse with palpation to area and with moving L shoulder. No SOB. No fever or chills. Recent prostate surgery which reports has been recovering well from at this point. No rash. No palpitations.  CSN: [id number]  Arrival date & time 07/20/12  1714   First MD Initiated Contact with Patient 07/20/12 1846      Chief Complaint  Patient presents with  . Chest Pain    (Consider location/radiation/quality/duration/timing/severity/associated sxs/prior treatment) HPI  Past Medical History  Diagnosis Date  . Asthma   . High cholesterol   . Hypertension     EKG 08/03/11 EPIC,   1 view chest 08/03/11 EPIC  . Cancer   . Sleep apnea     STOP BANG SCORE 5    Past Surgical History  Procedure Date  . Appendectomy 2005  . Colonoscopy 02/14/2012    Procedure: COLONOSCOPY;  Surgeon: Dalia Heading, MD;  Location: [facility];  Service: Gastroenterology;  Laterality: N/A;  . Hernia repair     umbilical  . Cardiac catheterization 07/2006    EPIC  . Robot assisted laparoscopic radical prostatectomy 07/16/2012    Procedure: ROBOTIC ASSISTED LAPAROSCOPIC RADICAL PROSTATECTOMY LEVEL 3;  Surgeon: Crecencio Mc, MD;  Location: [facility];  Service: Urology;  Laterality: N/A;        No family history on file.  History  Substance Use Topics  . Smoking status: Never Smoker   . Smokeless tobacco: Never Used  . Alcohol Use: No      Review of Systems   Review of symptoms negative unless otherwise noted in HPI.   Allergies  Sulfa antibiotics  Home Medications   Current Outpatient Rx  Name Route Sig Dispense Refill  . ALBUTEROL SULFATE HFA 108 (90 BASE) MCG/ACT IN AERS Inhalation Inhale 2 puffs into the lungs every 6 (six) hours as needed. Wheezing/ pt states doesn't have one at home    . ALBUTEROL SULFATE (2.5 MG/3ML) 0.083% IN NEBU Nebulization Take 2.5 mg by  nebulization every 4 (four) hours as needed. For shortness of breath    . ASPIRIN EC 81 MG PO TBEC Oral Take 81 mg by mouth daily.    . ATORVASTATIN CALCIUM 10 MG PO TABS Oral Take 10 mg by mouth at bedtime.    Marland Kitchen CIPROFLOXACIN HCL 500 MG PO TABS Oral Take 1 tablet (500 mg total) by mouth 2 (two) times daily. Start day prior to office visit for foley removal 6 tablet 0  . DOCUSATE SODIUM 100 MG PO CAPS Oral Take 200 mg by mouth every morning.    Marland Kitchen HYDROCODONE-ACETAMINOPHEN 5-325 MG PO TABS Oral Take 1-2 tablets by mouth every 6 (six) hours as needed for pain. 30 tablet 0  . LOSARTAN POTASSIUM-HCTZ 100-12.5 MG PO TABS Oral Take 1 tablet by mouth daily with breakfast.      BP 132/76  Pulse 91  Temp 97.8 F (36.6 C) (Oral)  Resp 20  Ht 5\' 8"  (1.727 m)  Wt 260 lb (117.935 kg)  BMI 39.53 kg/m2  SpO2 97%  Physical Exam  Nursing note and vitals reviewed. Constitutional: He appears well-developed and well-nourished. No distress.  HENT:  Head: Normocephalic and atraumatic.  Eyes: Conjunctivae are normal. Right eye exhibits no discharge. Left eye exhibits no discharge.  Neck: Neck supple.  Cardiovascular: Normal rate, regular rhythm and  normal heart sounds.  Exam reveals no gallop and no friction rub.   No murmur heard. Pulmonary/Chest: Effort normal and breath sounds normal. No respiratory distress. He has no wheezes. He exhibits tenderness.       Point tenderness anterior chest wall just below lateral third of clavicle. No overlying skin changes. No crepitus. Lungs clear. Breath sounds symmetric.  Abdominal: Soft. He exhibits no distension. There is tenderness.       Mild diffuse tenderness which seems appropriate given recent surgery. Incisions intact.   Genitourinary:       foley  Musculoskeletal: He exhibits no edema and no tenderness.       Lower extremities symmetric as compared to each other. No calf tenderness. Negative Homan's. No palpable cords.   Neurological: He is alert.    Skin: Skin is warm and dry.  Psychiatric: He has a normal mood and affect. His behavior is normal. Thought content normal.    ED Course  Procedures (including critical care time)  Labs Reviewed  CBC WITH DIFFERENTIAL - Abnormal; Notable for the following:    WBC 10.9 (*)     Hemoglobin 12.0 (*)     HCT 35.1 (*)     All other components within normal limits  BASIC METABOLIC PANEL - Abnormal; Notable for the following:    Sodium 133 (*)     Chloride 94 (*)     Glucose, Bld 130 (*)     All other components within normal limits  TROPONIN I   Dg Chest 2 View  07/20/2012  *RADIOLOGY REPORT*  Clinical Data: Abdominal pain.  CHEST - 2 VIEW  Comparison: 08/02/2011  Findings: Left lower lung atelectasis.  Right lung is clear.  There is stable cardiomegaly.  No effusions.  No acute bony abnormality.  IMPRESSION: Left lung atelectasis.  Stable cardiomegaly.  Original Report Authenticated By: Cyndie Chime, M.D.   EKG:  Rhythm: normal sinus w/ PVC Rate: 87 Axis: indeterminant Intervals: RBBB ST segments: NS ST changes Comparison: Stable from prior 08/03/11   1. Chest pain       MDM  56yM with CP. Consider ACS, Infectious, PE, Dissection, Pneumothorax, Peri/myocarditis, Musculoskeletal. Suspect musculoskeletal given onset while straining to reach with L arm, reproducibility with palpation and ROM of shoulder. Atypical for ACS. EKG stable from previous. CXR without acute findings. Trop normal. Plan symptomatic tx. Return precautions discussed.        Raeford Razor, MD 07/27/12 [phone]

## 2012-07-20 NOTE — ED Notes (Signed)
C/o left upper chest pain x 2 days; states, "I think I pulled it".  Reports pain onset after reaching awkwardly above his head.

## 2012-07-20 NOTE — ED Notes (Signed)
Pt c/o chest pain x 2 days. Pt states the pain is worse when he moves or presses on his chest.

## 2012-07-24 ENCOUNTER — Ambulatory Visit (HOSPITAL_COMMUNITY)
Admission: RE | Admit: 2012-07-24 | Discharge: 2012-07-24 | Disposition: A | Discharge status: Home / Self Care | Payer: 59 | Source: Ambulatory Visit | Attending: Urology | Admitting: Urology

## 2012-07-24 ENCOUNTER — Other Ambulatory Visit (HOSPITAL_COMMUNITY): Payer: Self-pay | Admitting: Urology

## 2012-07-24 DIAGNOSIS — R079 Chest pain, unspecified: Secondary | ICD-10-CM | POA: Insufficient documentation

## 2012-07-24 DIAGNOSIS — Z9079 Acquired absence of other genital organ(s): Secondary | ICD-10-CM | POA: Insufficient documentation

## 2012-07-24 DIAGNOSIS — I10 Essential (primary) hypertension: Secondary | ICD-10-CM | POA: Insufficient documentation

## 2013-05-18 ENCOUNTER — Encounter (HOSPITAL_COMMUNITY): Payer: Self-pay

## 2013-05-18 ENCOUNTER — Emergency Department (HOSPITAL_COMMUNITY)
Admission: EM | Admit: 2013-05-18 | Discharge: 2013-05-19 | Disposition: A | Discharge status: Home / Self Care | Payer: 59 | Attending: Emergency Medicine | Admitting: Emergency Medicine

## 2013-05-18 DIAGNOSIS — G4730 Sleep apnea, unspecified: Secondary | ICD-10-CM | POA: Insufficient documentation

## 2013-05-18 DIAGNOSIS — I10 Essential (primary) hypertension: Secondary | ICD-10-CM | POA: Insufficient documentation

## 2013-05-18 DIAGNOSIS — R61 Generalized hyperhidrosis: Secondary | ICD-10-CM | POA: Insufficient documentation

## 2013-05-18 DIAGNOSIS — J209 Acute bronchitis, unspecified: Secondary | ICD-10-CM

## 2013-05-18 DIAGNOSIS — Z79899 Other long term (current) drug therapy: Secondary | ICD-10-CM | POA: Insufficient documentation

## 2013-05-18 DIAGNOSIS — J3489 Other specified disorders of nose and nasal sinuses: Secondary | ICD-10-CM | POA: Insufficient documentation

## 2013-05-18 DIAGNOSIS — Z7982 Long term (current) use of aspirin: Secondary | ICD-10-CM | POA: Insufficient documentation

## 2013-05-18 DIAGNOSIS — IMO0001 Reserved for inherently not codable concepts without codable children: Secondary | ICD-10-CM | POA: Insufficient documentation

## 2013-05-18 DIAGNOSIS — R6883 Chills (without fever): Secondary | ICD-10-CM | POA: Insufficient documentation

## 2013-05-18 DIAGNOSIS — E7800 Pure hypercholesterolemia, unspecified: Secondary | ICD-10-CM | POA: Insufficient documentation

## 2013-05-18 DIAGNOSIS — Z9889 Other specified postprocedural states: Secondary | ICD-10-CM | POA: Insufficient documentation

## 2013-05-18 DIAGNOSIS — R0789 Other chest pain: Secondary | ICD-10-CM | POA: Insufficient documentation

## 2013-05-18 DIAGNOSIS — J45901 Unspecified asthma with (acute) exacerbation: Secondary | ICD-10-CM | POA: Insufficient documentation

## 2013-05-18 MED ORDER — ALBUTEROL SULFATE (5 MG/ML) 0.5% IN NEBU
2.5000 mg | INHALATION_SOLUTION | Freq: Once | RESPIRATORY_TRACT | Status: AC
  Administered 2013-05-19: 2.5 mg via RESPIRATORY_TRACT
  Filled 2013-05-18: qty 0.5

## 2013-05-18 MED ORDER — IPRATROPIUM BROMIDE 0.02 % IN SOLN
0.5000 mg | Freq: Once | RESPIRATORY_TRACT | Status: AC
  Administered 2013-05-19: 0.5 mg via RESPIRATORY_TRACT
  Filled 2013-05-18: qty 2.5

## 2013-05-18 NOTE — ED Notes (Signed)
I have a bad chest cold, nasal congestion, can't half breathe per pt. Bad cough, history of asthma per pt.

## 2013-05-18 NOTE — ED Provider Notes (Signed)
History    This chart was scribed for Dione Booze, MD by Sofie Rower, ED Scribe. The patient was seen in room [facility]12/[facility]12 and the patient's care was started at 11:49PM.    CSN: [id number]  Arrival date & time 05/18/13  2232   First MD Initiated Contact with Patient 05/18/13 2349      Chief Complaint  Patient presents with  . Nasal Congestion  . Cough  . Bronchitis    (Consider location/radiation/quality/duration/timing/severity/associated sxs/prior treatment) The history is provided by the patient. No language interpreter was used.    Corey Carson is a 57 y.o. male , with a hx of asthma, high cholesterol, hypertension, sleep apnea, appendectomy, and cardiac catheterization (Performed in 07/2006), who presents to the Emergency Department complaining of sudden, progressively worsening, nasal congestion, onset today (05/18/13).  Associated symptoms include productive clear cough, right lateral chest pain, sweats, myalgias, and chills. The pt reports he has been experiencing what he believes to be a chest cold, all day, throughout the day today. The pt has taken ibuprofen and applied his albuterol inhaler PTA, both of which, he informs, do not provide relief of his nasal congestion nor associated symptoms. 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   The pt does not smoke or drink alcohol.   PCP is Dr. Phillips Odor.    Past Medical History  Diagnosis Date  . Asthma   . High cholesterol   . Hypertension     EKG 08/03/11 EPIC,   1 view chest 08/03/11 EPIC  . Cancer   . Sleep apnea     STOP BANG SCORE 5    Past Surgical History  Procedure Laterality Date  . Appendectomy  2005  . Colonoscopy  02/14/2012    Procedure: COLONOSCOPY;  Surgeon: Dalia Heading, MD;  Location: [facility];  Service:  Gastroenterology;  Laterality: N/A;  . Hernia repair      umbilical  . Cardiac catheterization  07/2006    EPIC  . Robot assisted laparoscopic radical prostatectomy  07/16/2012    Procedure: ROBOTIC ASSISTED LAPAROSCOPIC RADICAL PROSTATECTOMY LEVEL 3;  Surgeon: Crecencio Mc, MD;  Location: [facility];  Service: Urology;  Laterality: N/A;        No family history on file.  History  Substance Use Topics  . Smoking status: Never Smoker   . Smokeless tobacco: Never Used  . Alcohol Use: No      Review of Systems  Constitutional: Positive for diaphoresis. Negative for fever.  HENT: Positive for congestion.   Respiratory: Positive for cough.   Cardiovascular: Positive for chest pain.  Gastrointestinal: Negative for nausea, vomiting and diarrhea.  Musculoskeletal: Positive for myalgias.  All other systems reviewed and are negative.    Allergies  Sulfa antibiotics  Home Medications   Current Outpatient Rx  Name  Route  Sig  Dispense  Refill  . albuterol (PROVENTIL HFA;VENTOLIN HFA) 108 (90 BASE) MCG/ACT inhaler   Inhalation   Inhale 2 puffs into the lungs every 6 (six) hours as needed. Wheezing/ pt states doesn't have one at home         . albuterol (PROVENTIL) (2.5 MG/3ML) 0.083% nebulizer solution   Nebulization   Take 2.5 mg by nebulization every 4 (four) hours as needed. For shortness of breath         .  aspirin EC 81 MG tablet   Oral   Take 81 mg by mouth daily.         Marland Kitchen atorvastatin (LIPITOR) 10 MG tablet   Oral   Take 10 mg by mouth at bedtime.         . docusate sodium (COLACE) 100 MG capsule   Oral   Take 200 mg by mouth every morning.         Marland Kitchen losartan-hydrochlorothiazide (HYZAAR) 100-12.5 MG per tablet   Oral   Take 1 tablet by mouth daily with breakfast.           BP 152/79  Pulse 79  Temp(Src) 98.6 F (37 C) (Oral)  Resp 18  Ht 5\' 11"  (1.803 m)  Wt 245 lb (111.131 kg)  BMI 34.19 kg/m2  SpO2 97%  Physical Exam  Nursing note and  vitals reviewed. Constitutional: He is oriented to person, place, and time. He appears well-developed and well-nourished. No distress.  HENT:  Head: Normocephalic and atraumatic.  Mouth/Throat: Oropharynx is clear and moist.  Eyes: EOM are normal. Pupils are equal, round, and reactive to light.  Neck: Neck supple. No tracheal deviation present.  Cardiovascular: Normal rate, regular rhythm and normal heart sounds.   Pulmonary/Chest: Effort normal. No respiratory distress. He has wheezes.  Prolonged exhalation phase, wheezing present with forced exhalation.   Abdominal: Soft. He exhibits no distension.  Musculoskeletal: Normal range of motion. He exhibits no edema.  Neurological: He is alert and oriented to person, place, and time. No sensory deficit.  Skin: Skin is warm and dry.  Psychiatric: He has a normal mood and affect. His behavior is normal.    ED Course  Procedures (including critical care time)  DIAGNOSTIC STUDIES: Oxygen Saturation is 97% on room air, normal by my interpretation.    COORDINATION OF CARE:  11:55 PM- Treatment plan concerning application of breathing treatment discussed with patient. Pt agrees with treatment.   Dg Chest 2 View  05/19/2013   *RADIOLOGY REPORT*  Clinical Data: Cough, shortness of breath.  CHEST - 2 VIEW  Comparison: 08/01/2012  Findings: Mild cardiomegaly.  Coarse bibasilar interstitial opacities.  No focal infiltrate.  No effusion.  Minimal spurring in the mid thoracic spine.  IMPRESSION:  No acute disease   Original Report Authenticated By: D. Andria Rhein, MD     1. Acute bronchitis       MDM  Acute bronchitis with bronchospasm. He will be given albuterol with Atrovent treatment and reassessed. Chest x-ray will be obtained to rule out pneumonia.  He had partial relief with albuterol with Atrovent. On exam, airflow is improved. He'll be given a second albuterol with Atrovent treatment as well as a dose of prednisone. Chest x-ray shows no  evidence of pneumonia.  After second treatment, he feels like he is back to baseline. On exam, lungs are clear. He is discharged with prescription for prednisone and advised to continue using his home nebulizer is needed.    I personally performed the services described in this documentation, which was scribed in my presence. The recorded information has been reviewed and is accurate.     Dione Booze, MD 05/19/13 [phone]

## 2013-05-19 ENCOUNTER — Emergency Department (HOSPITAL_COMMUNITY): Payer: 59

## 2013-05-19 MED ORDER — PREDNISONE 50 MG PO TABS: 50.0000 mg | ORAL_TABLET | Freq: Every day | ORAL | Status: DC

## 2013-05-19 MED ORDER — IPRATROPIUM BROMIDE 0.02 % IN SOLN
0.5000 mg | Freq: Once | RESPIRATORY_TRACT | Status: AC
  Administered 2013-05-19: 0.5 mg via RESPIRATORY_TRACT
  Filled 2013-05-19: qty 2.5

## 2013-05-19 MED ORDER — PREDNISONE 50 MG PO TABS
60.0000 mg | ORAL_TABLET | Freq: Once | ORAL | Status: AC
  Administered 2013-05-19: 60 mg via ORAL
  Filled 2013-05-19: qty 1

## 2013-05-19 MED ORDER — ALBUTEROL SULFATE (5 MG/ML) 0.5% IN NEBU
2.5000 mg | INHALATION_SOLUTION | Freq: Once | RESPIRATORY_TRACT | Status: AC
  Administered 2013-05-19: 2.5 mg via RESPIRATORY_TRACT
  Filled 2013-05-19: qty 0.5

## 2014-06-21 ENCOUNTER — Emergency Department (HOSPITAL_COMMUNITY): Payer: BC Managed Care – PPO

## 2014-06-21 ENCOUNTER — Emergency Department (HOSPITAL_COMMUNITY)
Admission: EM | Admit: 2014-06-21 | Discharge: 2014-06-21 | Disposition: A | Discharge status: Home / Self Care | Payer: BC Managed Care – PPO | Attending: Emergency Medicine | Admitting: Emergency Medicine

## 2014-06-21 ENCOUNTER — Encounter (HOSPITAL_COMMUNITY): Payer: Self-pay | Admitting: Emergency Medicine

## 2014-06-21 DIAGNOSIS — J45909 Unspecified asthma, uncomplicated: Secondary | ICD-10-CM | POA: Insufficient documentation

## 2014-06-21 DIAGNOSIS — Z9889 Other specified postprocedural states: Secondary | ICD-10-CM | POA: Insufficient documentation

## 2014-06-21 DIAGNOSIS — IMO0002 Reserved for concepts with insufficient information to code with codable children: Secondary | ICD-10-CM | POA: Insufficient documentation

## 2014-06-21 DIAGNOSIS — I10 Essential (primary) hypertension: Secondary | ICD-10-CM | POA: Insufficient documentation

## 2014-06-21 DIAGNOSIS — Z859 Personal history of malignant neoplasm, unspecified: Secondary | ICD-10-CM | POA: Insufficient documentation

## 2014-06-21 DIAGNOSIS — Z7982 Long term (current) use of aspirin: Secondary | ICD-10-CM | POA: Insufficient documentation

## 2014-06-21 DIAGNOSIS — E7800 Pure hypercholesterolemia, unspecified: Secondary | ICD-10-CM | POA: Insufficient documentation

## 2014-06-21 DIAGNOSIS — Z79899 Other long term (current) drug therapy: Secondary | ICD-10-CM | POA: Insufficient documentation

## 2014-06-21 DIAGNOSIS — M436 Torticollis: Secondary | ICD-10-CM | POA: Insufficient documentation

## 2014-06-21 MED ORDER — OXYCODONE-ACETAMINOPHEN 5-325 MG PO TABS: 1.0000 | ORAL_TABLET | ORAL | Status: DC | PRN

## 2014-06-21 MED ORDER — PREDNISONE 10 MG PO TABS: ORAL_TABLET | ORAL | Status: DC

## 2014-06-21 MED ORDER — KETOROLAC TROMETHAMINE 60 MG/2ML IM SOLN
60.0000 mg | Freq: Once | INTRAMUSCULAR | Status: AC
  Administered 2014-06-21: 60 mg via INTRAMUSCULAR
  Filled 2014-06-21: qty 2

## 2014-06-21 MED ORDER — DIAZEPAM 5 MG PO TABS: 5.0000 mg | ORAL_TABLET | Freq: Three times a day (TID) | ORAL | Status: DC | PRN

## 2014-06-21 MED ORDER — OXYCODONE-ACETAMINOPHEN 5-325 MG PO TABS
1.0000 | ORAL_TABLET | Freq: Once | ORAL | Status: AC
  Administered 2014-06-21: 1 via ORAL
  Filled 2014-06-21: qty 1

## 2014-06-21 MED ORDER — METHOCARBAMOL 500 MG PO TABS
1000.0000 mg | ORAL_TABLET | Freq: Once | ORAL | Status: AC
  Administered 2014-06-21: 1000 mg via ORAL
  Filled 2014-06-21: qty 2

## 2014-06-21 NOTE — ED Provider Notes (Signed)
CSN: [id number]     Arrival date & time 06/21/14  2034 History   First MD Initiated Contact with Patient 06/21/14 2043     Chief Complaint  Patient presents with  . Neck Pain     (Consider location/radiation/quality/duration/timing/severity/associated sxs/prior Treatment) The history is provided by the patient.     Corey Carson is a 58 y.o. male presenting with bilateral posterior neck pain which radiates into his occipital scalp and has been present for about 3 weeks.  Pain is worsened with movement and palpation of the area.  He denies injury, fevers, chills, weakness or numbness in his upper extremities, and has had no prior history of chronic neck or back problems. Also he denies earache, ear pain, facial pain, nasal or sinus congestion or visual changes.  He has taken ibuprofen up to 800 mg without relief of symptoms.  He works a job with frequent lifting and twisting but denies specific injury.  Past medical history is significant for htn and asthma.  He denies chest pain, sob, wheezing, increased cough.He does report that with certain movements of the neck his pain is so severe it causes nausea and has had several episodes of vomiting.  He has not seen his pcp for this complaint.    Past Medical History  Diagnosis Date  . Asthma   . High cholesterol   . Hypertension     EKG 08/03/11 EPIC,   1 view chest 08/03/11 EPIC  . Cancer   . Sleep apnea     STOP BANG SCORE 5   Past Surgical History  Procedure Laterality Date  . Appendectomy  2005  . Colonoscopy  02/14/2012    Procedure: COLONOSCOPY;  Surgeon: Jamesetta So, MD;  Location: [facility];  Service: Gastroenterology;  Laterality: N/A;  . Hernia repair      umbilical  . Cardiac catheterization  07/2006    EPIC  . Robot assisted laparoscopic radical prostatectomy  07/16/2012    Procedure: ROBOTIC ASSISTED LAPAROSCOPIC RADICAL PROSTATECTOMY LEVEL 3;  Surgeon: Dutch Gray, MD;  Location: [facility];  Service: Urology;   Laterality: N/A;       History reviewed. No pertinent family history. History  Substance Use Topics  . Smoking status: Never Smoker   . Smokeless tobacco: Never Used  . Alcohol Use: No    Review of Systems  Constitutional: Negative for chills.  HENT: Negative for congestion, ear discharge, ear pain, facial swelling and tinnitus.   Musculoskeletal: Negative for joint swelling and myalgias.  Neurological: Negative for dizziness and light-headedness.      Allergies  Sulfa antibiotics  Home Medications   Prior to Admission medications   Medication Sig Start Date End Date Taking? Authorizing Provider  albuterol (PROVENTIL HFA;VENTOLIN HFA) 108 (90 BASE) MCG/ACT inhaler Inhale 2 puffs into the lungs every 6 (six) hours as needed. Wheezing/ pt states doesn't have one at home    Historical Provider, MD  albuterol (PROVENTIL) (2.5 MG/3ML) 0.083% nebulizer solution Take 2.5 mg by nebulization every 4 (four) hours as needed. For shortness of breath    Historical Provider, MD  aspirin EC 81 MG tablet Take 81 mg by mouth daily.    Historical Provider, MD  atorvastatin (LIPITOR) 10 MG tablet Take 10 mg by mouth at bedtime.    Historical Provider, MD  diazepam (VALIUM) 5 MG tablet Take 1 tablet (5 mg total) by mouth every 8 (eight) hours as needed for muscle spasms. 06/21/14   Evalee Jefferson, PA-C  docusate sodium (COLACE) 100 MG capsule Take 200 mg by mouth every morning.    Historical Provider, MD  losartan-hydrochlorothiazide (HYZAAR) 100-12.5 MG per tablet Take 1 tablet by mouth daily with breakfast.    Historical Provider, MD  oxyCODONE-acetaminophen (PERCOCET/ROXICET) 5-325 MG per tablet Take 1 tablet by mouth every 4 (four) hours as needed. 06/21/14   Evalee Jefferson, PA-C  predniSONE (DELTASONE) 10 MG tablet 6, 5, 4, 3, 2 then 1 tablet by mouth daily for 6 days total. 06/21/14   Evalee Jefferson, PA-C  predniSONE (DELTASONE) 50 MG tablet Take 1 tablet (50 mg total) by mouth daily. 1/63/84   Delora Fuel,  MD   BP 536/46  Pulse 73  Temp(Src) 98.4 F (36.9 C) (Oral)  Ht 5\' 11"  (1.803 m)  Wt 250 lb (113.399 kg)  BMI 34.88 kg/m2  SpO2 97% Physical Exam  Constitutional: He appears well-developed and well-nourished.  HENT:  Head: Atraumatic.  Eyes: Conjunctivae and EOM are normal. Pupils are equal, round, and reactive to light.  Neck: Spinous process tenderness and muscular tenderness present. Carotid bruit is not present. No rigidity. Decreased range of motion present. No edema present. No mass present.  Pt can flex the neck,  Increased pain with extension and with bilateral rotation.    Cardiovascular:  Pulses equal bilaterally  Musculoskeletal: He exhibits tenderness.  Neurological: He is alert. He has normal strength. He displays normal reflexes. No cranial nerve deficit or sensory deficit. GCS eye subscore is 4. GCS verbal subscore is 5. GCS motor subscore is 6.  Equal grip strength.  Skin: Skin is warm and dry.  Psychiatric: He has a normal mood and affect.    ED Course  Procedures (including critical care time) Labs Review Labs Reviewed - No data to display  Imaging Review Ct Cervical Spine Wo Contrast  06/21/2014   CLINICAL DATA:  Worsening neck pain and stiffness for 3 weeks. Nausea and vomiting.  EXAM: CT CERVICAL SPINE WITHOUT CONTRAST  TECHNIQUE: Multidetector CT imaging of the cervical spine was performed without intravenous contrast. Multiplanar CT image reconstructions were also generated.  COMPARISON:  None.  FINDINGS: There is no evidence of fracture or subluxation. Vertebral bodies demonstrate normal height and alignment. Intervertebral disc spaces are preserved. Prevertebral soft tissues are within normal limits. The visualized neural foramina are grossly unremarkable.  The thyroid gland is unremarkable in appearance. The visualized lung apices are clear. No significant soft tissue abnormalities are seen. The visualized portions of the brain are unremarkable in  appearance.  IMPRESSION: No evidence of fracture or subluxation along the cervical spine.   Electronically Signed   By: Garald Balding M.D.   On: 06/21/2014 22:44     EKG Interpretation None      MDM   Final diagnoses:  Torticollis, acute    Patients labs and/or radiological studies were viewed and considered during the medical decision making and disposition process.  Pt prescribed valium, prednisone, oxycodone.  Heat tx.  F/u with pcp if not improving this week.     Evalee Jefferson, PA-C 06/21/14 2328

## 2014-06-21 NOTE — ED Notes (Signed)
Pain in bilateral occiput radiating to base of cervical spine x 3 weeks.  Limits rotational neck movement by causing severe stabbing pain at bilateral base of neck.  Denies numbness/tingling/loss of arm strength or coordination.  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

## 2014-06-21 NOTE — ED Notes (Signed)
Olga Millers, PA made aware of no pain relief from Percocet.

## 2014-06-21 NOTE — ED Provider Notes (Signed)
Medical screening examination/treatment/procedure(s) were performed by non-physician practitioner and as supervising physician I was immediately available for consultation/collaboration.   EKG Interpretation None        Orpah Greek, MD 06/21/14 2340

## 2014-06-21 NOTE — ED Notes (Signed)
Pt c/o neck pain x 3 weeks. No known injury.

## 2014-06-21 NOTE — ED Notes (Signed)
Discharge instructions given and reviewed with patient.  Prescriptions given for Percocet, Valium and Prednisone; effects and use explained.  Patient verbalized understanding of sedating effects of medications and to take Prednisone taper as directed.  Patient ambulatory; discharged home in good condition.  Significant other accompanied discharge to drive home.

## 2014-06-21 NOTE — Discharge Instructions (Signed)
Torticollis, Acute You have suddenly (acutely) developed a twisted neck (torticollis). This is usually a self-limited condition. CAUSES  Acute torticollis may be caused by malposition, trauma or infection. Most commonly, acute torticollis is caused by sleeping in an awkward position. Torticollis may also be caused by the flexion, extension or twisting of the neck muscles beyond their normal position. Sometimes, the exact cause may not be known. SYMPTOMS  Usually, there is pain and limited movement of the neck. Your neck may twist to one side. DIAGNOSIS  The diagnosis is often made by physical examination. X-rays, CT scans or MRIs may be done if there is a history of trauma or concern of infection. TREATMENT  For a common, stiff neck that develops during sleep, treatment is focused on relaxing the contracted neck muscle. Medications (including shots) may be used to treat the problem. Most cases resolve in several days. Torticollis usually responds to conservative physical therapy. If left untreated, the shortened and spastic neck muscle can cause deformities in the face and neck. Rarely, surgery is required. HOME CARE INSTRUCTIONS   Use over-the-counter and prescription medications as directed by your caregiver.  Do stretching exercises and massage the neck as directed by your caregiver.  Follow up with physical therapy if needed and as directed by your caregiver. SEEK IMMEDIATE MEDICAL CARE IF:   You develop difficulty breathing or noisy breathing (stridor).  You drool, develop trouble swallowing or have pain with swallowing.  You develop numbness or weakness in the hands or feet.  You have changes in speech or vision.  You have problems with urination or bowel movements.  You have difficulty walking.  You have a fever.  You have increased pain. MAKE SURE YOU:   Understand these instructions.  Will watch your condition.  Will get help right away if you are not doing well or  get worse. Document Released: 11/18/2000 Document Revised: 02/13/2012 Document Reviewed: 12/30/2009 [facility] Patient Information 2015 [place], [place]. This information is not intended to replace advice given to you by your health care provider. Make sure you discuss any questions you have with your health care provider.   Use the medicines prescribed.  You may take the oxycodone prescribed for pain relief.  This will make you drowsy - do not drive within 4 hours of taking this medication.  Valium will also make you sleepy.  Use caution with this medicine.  Apply a heating pad to your neck 20 minutes several times daily which may help relax your neck muscles. Call your doctor for a recheck if not improving over the next several days.

## 2014-08-22 ENCOUNTER — Ambulatory Visit (INDEPENDENT_AMBULATORY_CARE_PROVIDER_SITE_OTHER): Payer: BC Managed Care – PPO | Admitting: Cardiovascular Disease

## 2014-08-22 ENCOUNTER — Encounter: Payer: Self-pay | Admitting: Cardiovascular Disease

## 2014-08-22 ENCOUNTER — Encounter (INDEPENDENT_AMBULATORY_CARE_PROVIDER_SITE_OTHER): Payer: Self-pay

## 2014-08-22 VITALS — BP 130/82 | HR 72 | Ht 67.0 in | Wt 259.0 lb

## 2014-08-22 DIAGNOSIS — R51 Headache: Secondary | ICD-10-CM

## 2014-08-22 DIAGNOSIS — J45909 Unspecified asthma, uncomplicated: Secondary | ICD-10-CM

## 2014-08-22 DIAGNOSIS — R55 Syncope and collapse: Secondary | ICD-10-CM | POA: Insufficient documentation

## 2014-08-22 DIAGNOSIS — R072 Precordial pain: Secondary | ICD-10-CM

## 2014-08-22 DIAGNOSIS — R519 Headache, unspecified: Secondary | ICD-10-CM

## 2014-08-22 DIAGNOSIS — J4550 Severe persistent asthma, uncomplicated: Secondary | ICD-10-CM

## 2014-08-22 DIAGNOSIS — R29898 Other symptoms and signs involving the musculoskeletal system: Secondary | ICD-10-CM

## 2014-08-22 NOTE — Patient Instructions (Signed)
Your physician recommends that you schedule a follow-up appointment in: 6 weeks  Your physician recommends that you continue on your current medications as directed. Please refer to the Current Medication list given to you today.   Your physician has requested that you have an echocardiogram. Echocardiography is a painless test that uses sound waves to create images of your heart. It provides your doctor with information about the size and shape of your heart and how well your heart's chambers and valves are working. This procedure takes approximately one hour. There are no restrictions for this procedure.  Non-Cardiac CT scanning of your head, (CAT scanning), is a noninvasive, special x-ray that produces cross-sectional images of the body using x-rays and a computer. CT scans help physicians diagnose and treat medical conditions. For some CT exams, a contrast material is used to enhance visibility in the area of the body being studied. CT scans provide greater clarity and reveal more details than regular x-ray exams.   Your physician has recommended that you wear an event monitor. Event monitors are medical devices that record the heart's electrical activity. Doctors most often [place] these monitors to diagnose arrhythmias. Arrhythmias are problems with the speed or rhythm of the heartbeat. The monitor is a small, portable device. You can wear one while you do your normal daily activities. This is usually used to diagnose what is causing palpitations/syncope (passing out).      You have been referred to pulmonary,Dr. Bartholomew Crews will call you to schedule an apt     Thank you for choosing [facility] !

## 2014-08-22 NOTE — Progress Notes (Signed)
Patient ID: Corey Carson, male   DOB: March 11, 1956, 58 y.o.   MRN: [id number]       CARDIOLOGY CONSULT NOTE  Patient ID: Corey Carson MRN: [id number] DOB/AGE: [birth year] 58 y.o.  Admit date: (Not on file) Primary Physician Purvis Kilts, MD  Reason for Consultation: Syncope  HPI: The patient is a 58 year old male with a past medical history significant for atypical chest pain, hypertension, hyperlipidemia and vasovagal syncope. He presented to cardiology in 2007 with a vasovagal syncopal episode and chest pain for which he underwent coronary angiography on 07/31/06 which demonstrated normal epicardial coronary vessels. He had normal left ventricular systolic function at that time, EF 50-55% by left ventricuolography. His pain was deemed to be musculoskeletal in etiology.  He said he has been bothered by asthma and becomes significantly short of breath with this. He does not see a pulmonologist. He has periods of episodic chest pain which occurs at work without any significant exertion lasting 10-20 minutes and resolves with rest. 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 He has had 2 syncopal episodes in the past year. The most recent episode occurred in the past several weeks. He was at home and began to turn around and experienced some mild chest pain, diaphoresis, dizziness, nausea and passed out for what he says was 25-30 minutes. It is difficult to ascertain how accurate the details of his history are. When asked about bowel and bladder incontinence, he says "yes that happened". He has been experiencing posterior headaches and has had episodic dizziness and near syncopal episodes as well. Approximately 2-3 years ago, he said he felt left arm weakness and heaviness but denies having had a stroke.   ECG performed in the office today demonstrates normal sinus rhythm with a right bundle branch block, QRS duration 176 ms with precordial T wave inversions. This similar to an ECG  dated 07/20/2012.  Soc: Married. Nonsmoker. 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   Allergies  Allergen Reactions  . Sulfa Antibiotics Shortness Of Breath and Swelling    Current Outpatient Prescriptions  Medication Sig Dispense Refill  . albuterol (PROVENTIL HFA;VENTOLIN HFA) 108 (90 BASE) MCG/ACT inhaler Inhale 2 puffs into the lungs every 6 (six) hours as needed. Wheezing/ pt states doesn't have one at home      . albuterol (PROVENTIL) (2.5 MG/3ML) 0.083% nebulizer solution Take 2.5 mg by nebulization every 4 (four) hours as needed. For shortness of breath      . aspirin EC 81 MG tablet Take 81 mg by mouth daily.      Marland Kitchen atorvastatin (LIPITOR) 10 MG tablet Take 10 mg by mouth at bedtime.      . diazepam (VALIUM) 5 MG tablet Take 1 tablet (5 mg total) by mouth every 8 (eight) hours as needed for muscle spasms.  10 tablet  0  . docusate sodium (COLACE) 100 MG capsule Take 200 mg by mouth every morning.      Marland Kitchen losartan-hydrochlorothiazide (HYZAAR) 100-12.5 MG per tablet Take 1 tablet by mouth daily with breakfast.      . oxyCODONE-acetaminophen (PERCOCET/ROXICET) 5-325 MG per tablet Take 1 tablet by mouth every 4 (four) hours as needed.  20 tablet  0  . predniSONE (DELTASONE) 10 MG tablet 6, 5, 4, 3, 2 then 1 tablet by mouth daily for 6 days total.  21 tablet  0  . predniSONE (DELTASONE) 50 MG tablet Take 1 tablet (50 mg total) by mouth daily.  5  tablet  0   No current facility-administered medications for this visit.    Past Medical History  Diagnosis Date  . Asthma   . High cholesterol   . Hypertension     EKG 08/03/11 EPIC,   1 view chest 08/03/11 EPIC  . Cancer   . Sleep apnea     STOP BANG SCORE 5    Past Surgical History  Procedure Laterality Date  . Appendectomy  2005  . Colonoscopy  02/14/2012    Procedure: COLONOSCOPY;  Surgeon: Jamesetta So, MD;  Location: [facility];  Service: Gastroenterology;  Laterality: N/A;  . Hernia repair      umbilical  .  Cardiac catheterization  07/2006    EPIC  . Robot assisted laparoscopic radical prostatectomy  07/16/2012    Procedure: ROBOTIC ASSISTED LAPAROSCOPIC RADICAL PROSTATECTOMY LEVEL 3;  Surgeon: Dutch Gray, MD;  Location: [facility];  Service: Urology;  Laterality: N/A;        History   Social History  . Marital Status: Married    Spouse Name: N/A    Number of Children: N/A  . Years of Education: N/A   Occupational History  . Not on file.   Social History Main Topics  . Smoking status: Never Smoker   . Smokeless tobacco: Never Used  . Alcohol Use: No  . Drug Use: No  . Sexual Activity:    Other Topics Concern  . Not on file   Social History Narrative  . No narrative on file     No family history of premature CAD in 1st degree relatives.  Prior to Admission medications   Medication Sig Start Date End Date Taking? Authorizing Provider  albuterol (PROVENTIL HFA;VENTOLIN HFA) 108 (90 BASE) MCG/ACT inhaler Inhale 2 puffs into the lungs every 6 (six) hours as needed. Wheezing/ pt states doesn't have one at home    Historical Provider, MD  albuterol (PROVENTIL) (2.5 MG/3ML) 0.083% nebulizer solution Take 2.5 mg by nebulization every 4 (four) hours as needed. For shortness of breath    Historical Provider, MD  aspirin EC 81 MG tablet Take 81 mg by mouth daily.    Historical Provider, MD  atorvastatin (LIPITOR) 10 MG tablet Take 10 mg by mouth at bedtime.    Historical Provider, MD  diazepam (VALIUM) 5 MG tablet Take 1 tablet (5 mg total) by mouth every 8 (eight) hours as needed for muscle spasms. 06/21/14   Evalee Jefferson, PA-C  docusate sodium (COLACE) 100 MG capsule Take 200 mg by mouth every morning.    Historical Provider, MD  losartan-hydrochlorothiazide (HYZAAR) 100-12.5 MG per tablet Take 1 tablet by mouth daily with breakfast.    Historical Provider, MD  oxyCODONE-acetaminophen (PERCOCET/ROXICET) 5-325 MG per tablet Take 1 tablet by mouth every 4 (four) hours as needed. 06/21/14   Evalee Jefferson, PA-C  predniSONE (DELTASONE) 10 MG tablet 6, 5, 4, 3, 2 then 1 tablet by mouth daily for 6 days total. 06/21/14   Evalee Jefferson, PA-C  predniSONE (DELTASONE) 50 MG tablet Take 1 tablet (50 mg total) by mouth daily. 05/31/02   Delora Fuel, MD     Review of systems complete and found to be negative unless listed above in HPI  Physical exam  BP 130/82 Pulse 72 Resp Temp Temp src SpO2 Weight 259 lb (117.482 kg) Height 5\' 7"  (1.702 m)    General: NAD Neck: No JVD, no thyromegaly or thyroid nodule.  Lungs: Clear to auscultation bilaterally with normal respiratory effort.  CV: Nondisplaced PMI. Regular rate and rhythm, normal S1/S2, no S3/S4, no murmur.  No peripheral edema.  No carotid bruit.  Normal pedal pulses.  Abdomen: Soft, nontender, no hepatosplenomegaly, no distention.  Skin: Intact without lesions or rashes.  Neurologic: Alert and oriented x 3.  Psych: Normal affect. Extremities: No clubbing or cyanosis.  HEENT: Normal.   ECG: Most recent ECG reviewed.  Labs:   Lab Results  Component Value Date   WBC 10.9* 07/20/2012   HGB 12.0* 07/20/2012   HCT 35.1* 07/20/2012   MCV 82.2 07/20/2012   PLT 318 07/20/2012   No results found for this basename: NA, K, CL, CO2, BUN, CREATININE, CALCIUM, LABALBU, PROT, BILITOT, ALKPHOS, ALT, AST, GLUCOSE,  in the last 168 hours Lab Results  Component Value Date   CKTOTAL 80 08/02/2011   CKMB 3.2 08/02/2011   TROPONINI <0.30 07/20/2012    No results found for this basename: CHOL   No results found for this basename: HDL   No results found for this basename: LDLCALC   No results found for this basename: TRIG   No results found for this basename: CHOLHDL   No results found for this basename: LDLDIRECT         Studies: No results found.  ASSESSMENT AND PLAN:  1. Syncope: Unclear history and etiology. Given his associated headaches, dizziness and history of left arm heaviness/weakness, I will obtain a head CT. To rule out arrhythmic  disturbances given his underlying RBBB (QRS 176 msec), I will obtain a 30-day event monitor. Will obtain an echocardiogram to evaluate for structural heart disease. He mentions a h/o bowel/bladder incontinence but I am uncertain if this was actually a seizure. I would consider an EEG in the future. 2. Chest pain: Given his normal coronary angiogram 8 years ago, unlikely to be cardiac in etiology and given his significant asthma, it may be more related to his lungs. I will obtain an echocardiogram for further clarification. 3. Asthma: Will make a referral to pulmonary (Dr. Luan Pulling).  Dispo: f/u 6 weeks.  Signed: Kate Sable, M.D., F.A.C.C.  08/22/2014, 9:09 AM

## 2014-08-27 ENCOUNTER — Ambulatory Visit (HOSPITAL_COMMUNITY): Payer: BC Managed Care – PPO

## 2014-08-27 ENCOUNTER — Ambulatory Visit (HOSPITAL_COMMUNITY)
Admission: RE | Admit: 2014-08-27 | Discharge: 2014-08-27 | Disposition: A | Discharge status: Home / Self Care | Payer: BC Managed Care – PPO | Source: Ambulatory Visit | Attending: Cardiovascular Disease | Admitting: Cardiovascular Disease

## 2014-08-27 DIAGNOSIS — R51 Headache: Secondary | ICD-10-CM | POA: Insufficient documentation

## 2014-08-27 DIAGNOSIS — R55 Syncope and collapse: Secondary | ICD-10-CM

## 2014-08-27 DIAGNOSIS — R29898 Other symptoms and signs involving the musculoskeletal system: Secondary | ICD-10-CM | POA: Diagnosis not present

## 2014-08-27 DIAGNOSIS — R519 Headache, unspecified: Secondary | ICD-10-CM

## 2014-09-03 ENCOUNTER — Ambulatory Visit (HOSPITAL_COMMUNITY): Payer: BC Managed Care – PPO | Attending: Cardiovascular Disease

## 2014-09-22 ENCOUNTER — Telehealth: Payer: Self-pay | Admitting: *Deleted

## 2014-09-22 NOTE — Telephone Encounter (Signed)
Received EOS report, in Dr. Bronson Ing folder

## 2014-09-23 ENCOUNTER — Other Ambulatory Visit: Payer: Self-pay | Admitting: *Deleted

## 2014-09-23 DIAGNOSIS — R55 Syncope and collapse: Secondary | ICD-10-CM

## 2014-09-24 ENCOUNTER — Telehealth: Payer: Self-pay | Admitting: *Deleted

## 2014-09-24 NOTE — Telephone Encounter (Signed)
Message copied by Desma Mcgregor on Wed Sep 24, 2014 12:43 PM ------      Message from: Kate Sable A      Created: Tue Sep 23, 2014  3:11 PM       No significant arrhythmias. ------

## 2014-09-24 NOTE — Telephone Encounter (Signed)
Pt aware confirmed f/u 11/2 with Dr. Bronson Ing, forwarded to Dr. Gerarda Fraction.

## 2014-10-08 ENCOUNTER — Encounter: Payer: Self-pay | Admitting: Cardiovascular Disease

## 2014-10-08 ENCOUNTER — Ambulatory Visit (INDEPENDENT_AMBULATORY_CARE_PROVIDER_SITE_OTHER): Payer: BC Managed Care – PPO | Admitting: Cardiovascular Disease

## 2014-10-08 VITALS — BP 148/86 | HR 64 | Ht 67.0 in | Wt 257.0 lb

## 2014-10-08 DIAGNOSIS — R072 Precordial pain: Secondary | ICD-10-CM

## 2014-10-08 DIAGNOSIS — R51 Headache: Secondary | ICD-10-CM

## 2014-10-08 DIAGNOSIS — J4550 Severe persistent asthma, uncomplicated: Secondary | ICD-10-CM

## 2014-10-08 DIAGNOSIS — R55 Syncope and collapse: Secondary | ICD-10-CM

## 2014-10-08 DIAGNOSIS — R519 Headache, unspecified: Secondary | ICD-10-CM

## 2014-10-08 NOTE — Progress Notes (Signed)
Patient ID: Corey Carson, male   DOB: [birth year], 58 y.o.   MRN: [id number]      SUBJECTIVE: The patient returns for follow up of investigations for syncope. He returned his cardiac event monitor today, thus results are unavailable. He did not obtain an echocardiogram. Head CT was without acute abnormalities.  In summary, he has a past medical history significant for atypical chest pain, hypertension, hyperlipidemia and vasovagal syncope. He presented to cardiology in 2007 with a vasovagal syncopal episode and chest pain for which he underwent coronary angiography on 07/31/06 which demonstrated normal epicardial coronary vessels. He had normal left ventricular systolic function at that time, EF 50-55% by left ventricuolography. His pain was deemed to be musculoskeletal in etiology.  He said he has been bothered by asthma and becomes significantly short of breath with this. He has yet to see a pulmonologist, as I made a referral at his last visit. He has periods of episodic chest pain which occurs at work without any significant exertion lasting 10-20 minutes and resolves with rest. 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 He has had 2 syncopal episodes in the past year. The most recent episode occurred in the past several weeks. He was at home and began to turn around and experienced some mild chest pain, diaphoresis, dizziness, nausea and passed out for what he says was 25-30 minutes. It is difficult to ascertain how accurate the details of his history are. When asked about bowel and bladder incontinence, he says "yes that happened". He has been experiencing posterior headaches and has had episodic dizziness and near syncopal episodes as well. Approximately 2-3 years ago, he said he felt left arm weakness and heaviness but denies having had a stroke.  He did not pass out while wearing the monitor.    Review of Systems: As per "subjective", otherwise negative.  Allergies  Allergen  Reactions  . Sulfa Antibiotics Shortness Of Breath and Swelling    Current Outpatient Prescriptions  Medication Sig Dispense Refill  . albuterol (PROVENTIL HFA;VENTOLIN HFA) 108 (90 BASE) MCG/ACT inhaler Inhale 2 puffs into the lungs every 6 (six) hours as needed. Wheezing/ pt states doesn't have one at home    . albuterol (PROVENTIL) (2.5 MG/3ML) 0.083% nebulizer solution Take 2.5 mg by nebulization every 4 (four) hours as needed. For shortness of breath    . amLODipine (NORVASC) 5 MG tablet Take 5 mg by mouth daily.   0  . aspirin EC 81 MG tablet Take 81 mg by mouth daily.    Marland Kitchen atorvastatin (LIPITOR) 10 MG tablet Take 10 mg by mouth at bedtime.    . diazepam (VALIUM) 5 MG tablet Take 1 tablet (5 mg total) by mouth every 8 (eight) hours as needed for muscle spasms. 10 tablet 0  . docusate sodium (COLACE) 100 MG capsule Take 200 mg by mouth every morning.    Marland Kitchen HYDROcodone-acetaminophen (NORCO/VICODIN) 5-325 MG per tablet Take 1 tablet by mouth every 6 (six) hours as needed.   0  . losartan-hydrochlorothiazide (HYZAAR) 100-12.5 MG per tablet Take 1 tablet by mouth daily with breakfast.    . oxyCODONE-acetaminophen (PERCOCET/ROXICET) 5-325 MG per tablet Take 1 tablet by mouth every 4 (four) hours as needed. 20 tablet 0   No current facility-administered medications for this visit.    Past Medical History  Diagnosis Date  . Asthma   . High cholesterol   . Hypertension     EKG 08/03/11 EPIC,   1 view chest  08/03/11 EPIC  . Cancer   . Sleep apnea     STOP BANG SCORE 5    Past Surgical History  Procedure Laterality Date  . Appendectomy  2005  . Colonoscopy  02/14/2012    Procedure: COLONOSCOPY;  Surgeon: Jamesetta So, MD;  Location: [facility];  Service: Gastroenterology;  Laterality: N/A;  . Hernia repair      umbilical  . Cardiac catheterization  07/2006    EPIC  . Robot assisted laparoscopic radical prostatectomy  07/16/2012    Procedure: ROBOTIC ASSISTED LAPAROSCOPIC RADICAL  PROSTATECTOMY LEVEL 3;  Surgeon: Dutch Gray, MD;  Location: [facility];  Service: Urology;  Laterality: N/A;        History   Social History  . Marital Status: Married    Spouse Name: N/A    Number of Children: N/A  . Years of Education: N/A   Occupational History  . Not on file.   Social History Main Topics  . Smoking status: Never Smoker   . Smokeless tobacco: Never Used  . Alcohol Use: No  . Drug Use: No  . Sexual Activity: Not on file   Other Topics Concern  . Not on file   Social History Narrative     Filed Vitals:   10/08/14 0928  BP: 148/86  Pulse: 64  Height: 5\' 7"  (1.702 m)  Weight: 257 lb (116.574 kg)    PHYSICAL EXAM General: NAD HEENT: Normal. Neck: No JVD, no thyromegaly. Lungs: Clear to auscultation bilaterally with normal respiratory effort. CV: Nondisplaced PMI.  Regular rate and rhythm, normal S1/S2, no S3/S4, no murmur. No pretibial or periankle edema.  No carotid bruit.  Normal pedal pulses.  Abdomen: Soft, nontender, no hepatosplenomegaly, no distention.  Neurologic: Alert and oriented x 3.  Psych: Normal affect. Skin: Normal. Musculoskeletal: Normal range of motion, no gross deformities. Extremities: No clubbing or cyanosis.   ECG: Most recent ECG reviewed.      ASSESSMENT AND PLAN: 1. Syncope: Unclear history and etiology. Head CT was unremarkable. I will await the results of the 30-day event monitor. I reminded him to obtain an echocardiogram to evaluate for structural heart disease. He mentions a h/o bowel/bladder incontinence but I am uncertain if this was actually a seizure. I would consider an EEG in the future. 2. Chest pain: Given his normal coronary angiogram 8 years ago, unlikely to be cardiac in etiology and given his significant asthma, it may be more related to his lungs. I will obtain an echocardiogram for further clarification. 3. Asthma: I previously made a referral to pulmonary (Dr. Luan Pulling).  Dispo: f/u 3  months.  Kate Sable, M.D., F.A.C.C.

## 2014-10-08 NOTE — Patient Instructions (Signed)
Your physician recommends that you schedule a follow-up appointment in: 3 months with Dr. Bronson Ing  Your physician recommends that you continue on your current medications as directed. Please refer to the Current Medication list given to you today.  Please schedule your echocardiogram  We will notify you with results from your monitor  If you do not receive a phone call from Dr. Luan Pulling office in 1 week please call our office.  Thank you for choosing [facility]!!

## 2014-10-09 ENCOUNTER — Ambulatory Visit (HOSPITAL_COMMUNITY)
Admission: RE | Admit: 2014-10-09 | Discharge: 2014-10-09 | Disposition: A | Discharge status: Home / Self Care | Payer: BC Managed Care – PPO | Source: Ambulatory Visit | Attending: Cardiovascular Disease | Admitting: Cardiovascular Disease

## 2014-10-09 ENCOUNTER — Telehealth: Payer: Self-pay | Admitting: *Deleted

## 2014-10-09 DIAGNOSIS — I371 Nonrheumatic pulmonary valve insufficiency: Secondary | ICD-10-CM | POA: Insufficient documentation

## 2014-10-09 DIAGNOSIS — R55 Syncope and collapse: Secondary | ICD-10-CM | POA: Insufficient documentation

## 2014-10-09 DIAGNOSIS — I10 Essential (primary) hypertension: Secondary | ICD-10-CM | POA: Diagnosis not present

## 2014-10-09 DIAGNOSIS — E785 Hyperlipidemia, unspecified: Secondary | ICD-10-CM | POA: Insufficient documentation

## 2014-10-09 DIAGNOSIS — I517 Cardiomegaly: Secondary | ICD-10-CM

## 2014-10-09 DIAGNOSIS — R079 Chest pain, unspecified: Secondary | ICD-10-CM | POA: Diagnosis not present

## 2014-10-09 DIAGNOSIS — G4730 Sleep apnea, unspecified: Secondary | ICD-10-CM | POA: Diagnosis not present

## 2014-10-09 DIAGNOSIS — I361 Nonrheumatic tricuspid (valve) insufficiency: Secondary | ICD-10-CM | POA: Diagnosis not present

## 2014-10-09 DIAGNOSIS — R072 Precordial pain: Secondary | ICD-10-CM

## 2014-10-09 DIAGNOSIS — I2721 Secondary pulmonary arterial hypertension: Secondary | ICD-10-CM

## 2014-10-09 NOTE — Progress Notes (Signed)
  Echocardiogram 2D Echocardiogram has been performed.  Brusly, Elbert 10/09/2014, 9:29 AM

## 2014-10-09 NOTE — Telephone Encounter (Signed)
-----   Message from Herminio Commons, MD sent at 10/09/2014 10:14 AM EST ----- He has pulmonary HTN. Needs CT angiography of the chest for further evaluation, and then needs to see pulmonary in [place] perhaps.

## 2014-10-09 NOTE — Telephone Encounter (Signed)
Forwarded to Dr. Gerarda Fraction. Put in orders for referral to pulmonary, labs, and CT angio of chest. Will forward to receptionist for appt.

## 2014-10-10 ENCOUNTER — Telehealth: Payer: Self-pay | Admitting: *Deleted

## 2014-10-10 DIAGNOSIS — I272 Pulmonary hypertension, unspecified: Secondary | ICD-10-CM

## 2014-10-10 LAB — CREATININE, SERUM: Creat: 0.7 mg/dL (ref 0.50–1.35)

## 2014-10-10 LAB — BUN: BUN: 14 mg/dL (ref 6–23)

## 2014-10-10 NOTE — Telephone Encounter (Signed)
-----   Message from Herminio Commons, MD sent at 10/09/2014 10:14 AM EST ----- He has pulmonary HTN. Needs CT angiography of the chest for further evaluation, and then needs to see pulmonary in [place] perhaps.

## 2014-10-10 NOTE — Telephone Encounter (Signed)
Pt is returing call to [facility], they are not sure if it was about labs work or not/tmj

## 2014-10-10 NOTE — Telephone Encounter (Signed)
Pt called back,he will get labs drawn today 10/10/14  CT chest already scheduled for 11/12 arrive 9:15 am clear liquids 4 hrs before,pt verbalized understanding

## 2014-10-16 ENCOUNTER — Ambulatory Visit (HOSPITAL_COMMUNITY): Payer: BC Managed Care – PPO

## 2014-10-21 ENCOUNTER — Other Ambulatory Visit (HOSPITAL_COMMUNITY): Payer: Self-pay | Admitting: Respiratory Therapy

## 2014-10-21 DIAGNOSIS — R0602 Shortness of breath: Secondary | ICD-10-CM

## 2014-10-29 ENCOUNTER — Ambulatory Visit (HOSPITAL_COMMUNITY): Admission: RE | Admit: 2014-10-29 | Payer: BC Managed Care – PPO | Source: Ambulatory Visit

## 2015-01-26 ENCOUNTER — Ambulatory Visit: Payer: BC Managed Care – PPO | Admitting: Cardiovascular Disease

## 2015-01-26 ENCOUNTER — Encounter: Payer: Self-pay | Admitting: *Deleted

## 2017-02-20 ENCOUNTER — Emergency Department (HOSPITAL_COMMUNITY): Payer: BLUE CROSS/BLUE SHIELD

## 2017-02-20 ENCOUNTER — Inpatient Hospital Stay (HOSPITAL_COMMUNITY)
Admission: EM | Admit: 2017-02-20 | Discharge: 2017-03-03 | DRG: 286 | Disposition: A | Discharge status: Home / Self Care | Payer: BLUE CROSS/BLUE SHIELD | Attending: Cardiology | Admitting: Cardiology

## 2017-02-20 ENCOUNTER — Encounter (HOSPITAL_COMMUNITY): Payer: Self-pay

## 2017-02-20 DIAGNOSIS — I272 Pulmonary hypertension, unspecified: Secondary | ICD-10-CM | POA: Diagnosis not present

## 2017-02-20 DIAGNOSIS — I472 Ventricular tachycardia, unspecified: Secondary | ICD-10-CM

## 2017-02-20 DIAGNOSIS — R4701 Aphasia: Secondary | ICD-10-CM | POA: Diagnosis not present

## 2017-02-20 DIAGNOSIS — I4510 Unspecified right bundle-branch block: Secondary | ICD-10-CM | POA: Diagnosis not present

## 2017-02-20 DIAGNOSIS — I110 Hypertensive heart disease with heart failure: Secondary | ICD-10-CM | POA: Diagnosis not present

## 2017-02-20 DIAGNOSIS — E871 Hypo-osmolality and hyponatremia: Secondary | ICD-10-CM | POA: Diagnosis not present

## 2017-02-20 DIAGNOSIS — I25110 Atherosclerotic heart disease of native coronary artery with unstable angina pectoris: Secondary | ICD-10-CM | POA: Diagnosis not present

## 2017-02-20 DIAGNOSIS — E876 Hypokalemia: Secondary | ICD-10-CM | POA: Diagnosis not present

## 2017-02-20 DIAGNOSIS — I4892 Unspecified atrial flutter: Principal | ICD-10-CM | POA: Diagnosis present

## 2017-02-20 DIAGNOSIS — G4730 Sleep apnea, unspecified: Secondary | ICD-10-CM

## 2017-02-20 DIAGNOSIS — J101 Influenza due to other identified influenza virus with other respiratory manifestations: Secondary | ICD-10-CM | POA: Diagnosis present

## 2017-02-20 DIAGNOSIS — I200 Unstable angina: Secondary | ICD-10-CM

## 2017-02-20 DIAGNOSIS — R079 Chest pain, unspecified: Secondary | ICD-10-CM

## 2017-02-20 DIAGNOSIS — I10 Essential (primary) hypertension: Secondary | ICD-10-CM

## 2017-02-20 DIAGNOSIS — R509 Fever, unspecified: Secondary | ICD-10-CM | POA: Diagnosis not present

## 2017-02-20 DIAGNOSIS — Z7901 Long term (current) use of anticoagulants: Secondary | ICD-10-CM

## 2017-02-20 DIAGNOSIS — R000 Tachycardia, unspecified: Secondary | ICD-10-CM

## 2017-02-20 DIAGNOSIS — I428 Other cardiomyopathies: Secondary | ICD-10-CM | POA: Diagnosis present

## 2017-02-20 DIAGNOSIS — R739 Hyperglycemia, unspecified: Secondary | ICD-10-CM

## 2017-02-20 DIAGNOSIS — Z79899 Other long term (current) drug therapy: Secondary | ICD-10-CM | POA: Diagnosis not present

## 2017-02-20 DIAGNOSIS — E1165 Type 2 diabetes mellitus with hyperglycemia: Secondary | ICD-10-CM | POA: Diagnosis not present

## 2017-02-20 DIAGNOSIS — I5082 Biventricular heart failure: Secondary | ICD-10-CM | POA: Diagnosis not present

## 2017-02-20 DIAGNOSIS — I959 Hypotension, unspecified: Secondary | ICD-10-CM | POA: Diagnosis present

## 2017-02-20 DIAGNOSIS — R4781 Slurred speech: Secondary | ICD-10-CM

## 2017-02-20 DIAGNOSIS — I4891 Unspecified atrial fibrillation: Secondary | ICD-10-CM | POA: Diagnosis not present

## 2017-02-20 DIAGNOSIS — R05 Cough: Secondary | ICD-10-CM

## 2017-02-20 DIAGNOSIS — E7800 Pure hypercholesterolemia, unspecified: Secondary | ICD-10-CM | POA: Diagnosis present

## 2017-02-20 DIAGNOSIS — R059 Cough, unspecified: Secondary | ICD-10-CM

## 2017-02-20 DIAGNOSIS — I5021 Acute systolic (congestive) heart failure: Secondary | ICD-10-CM | POA: Diagnosis not present

## 2017-02-20 DIAGNOSIS — I484 Atypical atrial flutter: Secondary | ICD-10-CM | POA: Diagnosis not present

## 2017-02-20 DIAGNOSIS — Z882 Allergy status to sulfonamides status: Secondary | ICD-10-CM

## 2017-02-20 DIAGNOSIS — I214 Non-ST elevation (NSTEMI) myocardial infarction: Secondary | ICD-10-CM

## 2017-02-20 LAB — DIFFERENTIAL
BASOS PCT: 1 %
Basophils Absolute: 0.1 10*3/uL (ref 0.0–0.1)
EOS PCT: 2 %
Eosinophils Absolute: 0.2 10*3/uL (ref 0.0–0.7)
Lymphocytes Relative: 32 %
Lymphs Abs: 2.9 10*3/uL (ref 0.7–4.0)
MONO ABS: 0.9 10*3/uL (ref 0.1–1.0)
Monocytes Relative: 10 %
Neutro Abs: 5.1 10*3/uL (ref 1.7–7.7)
Neutrophils Relative %: 55 %

## 2017-02-20 LAB — CBC
HCT: 45.9 % (ref 39.0–52.0)
Hemoglobin: 15.4 g/dL (ref 13.0–17.0)
MCH: 28.4 pg (ref 26.0–34.0)
MCHC: 33.6 g/dL (ref 30.0–36.0)
MCV: 84.5 fL (ref 78.0–100.0)
PLATELETS: 260 10*3/uL (ref 150–400)
RBC: 5.43 MIL/uL (ref 4.22–5.81)
RDW: 13.6 % (ref 11.5–15.5)
WBC: 9.2 10*3/uL (ref 4.0–10.5)

## 2017-02-20 LAB — PROTIME-INR
INR: 1.08
PROTHROMBIN TIME: 14 s (ref 11.4–15.2)

## 2017-02-20 LAB — COMPREHENSIVE METABOLIC PANEL
ALK PHOS: 69 U/L (ref 38–126)
ALT: 54 U/L (ref 17–63)
ANION GAP: 10 (ref 5–15)
AST: 37 U/L (ref 15–41)
Albumin: 3.7 g/dL (ref 3.5–5.0)
BUN: 23 mg/dL — ABNORMAL HIGH (ref 6–20)
CALCIUM: 8.7 mg/dL — AB (ref 8.9–10.3)
CO2: 21 mmol/L — AB (ref 22–32)
Chloride: 105 mmol/L (ref 101–111)
Creatinine, Ser: 1.01 mg/dL (ref 0.61–1.24)
GFR calc non Af Amer: >60 mL/min (ref >60)
Glucose, Bld: 164 mg/dL — ABNORMAL HIGH (ref 65–99)
Potassium: 3.6 mmol/L (ref 3.5–5.1)
SODIUM: 136 mmol/L (ref 135–145)
Total Bilirubin: 1 mg/dL (ref 0.3–1.2)
Total Protein: 6.4 g/dL — ABNORMAL LOW (ref 6.5–8.1)

## 2017-02-20 LAB — ETHANOL

## 2017-02-20 LAB — I-STAT CHEM 8, ED
BUN: 23 mg/dL — ABNORMAL HIGH (ref 6–20)
CALCIUM ION: 1.07 mmol/L — AB (ref 1.15–1.40)
Chloride: 105 mmol/L (ref 101–111)
Creatinine, Ser: 1 mg/dL (ref 0.61–1.24)
Glucose, Bld: 161 mg/dL — ABNORMAL HIGH (ref 65–99)
HCT: 50 % (ref 39.0–52.0)
Hemoglobin: 17 g/dL (ref 13.0–17.0)
Potassium: 3.6 mmol/L (ref 3.5–5.1)
SODIUM: 141 mmol/L (ref 135–145)
TCO2: 21 mmol/L (ref 0–100)

## 2017-02-20 LAB — APTT: aPTT: 27 seconds (ref 24–36)

## 2017-02-20 LAB — I-STAT TROPONIN, ED: Troponin i, poc: 0.12 ng/mL (ref 0.00–0.08)

## 2017-02-20 LAB — MAGNESIUM: Magnesium: 1.7 mg/dL (ref 1.7–2.4)

## 2017-02-20 MED ORDER — SODIUM CHLORIDE 0.9 % IV SOLN
100.0000 mL/h | INTRAVENOUS | Status: DC
  Administered 2017-02-20 – 2017-02-21 (×3): 100 mL/h via INTRAVENOUS

## 2017-02-20 MED ORDER — ASPIRIN 81 MG PO CHEW
324.0000 mg | CHEWABLE_TABLET | Freq: Once | ORAL | Status: AC
  Administered 2017-02-20: 324 mg via ORAL
  Filled 2017-02-20: qty 4

## 2017-02-20 MED ORDER — AMIODARONE HCL IN DEXTROSE 360-4.14 MG/200ML-% IV SOLN
60.0000 mg/h | INTRAVENOUS | Status: DC
  Administered 2017-02-20: 60 mg/h via INTRAVENOUS
  Filled 2017-02-20: qty 200

## 2017-02-20 MED ORDER — AMIODARONE IV BOLUS ONLY 150 MG/100ML
INTRAVENOUS | Status: AC
  Filled 2017-02-20: qty 100

## 2017-02-20 MED ORDER — FENTANYL CITRATE (PF) 100 MCG/2ML IJ SOLN
50.0000 ug | Freq: Once | INTRAMUSCULAR | Status: AC
  Administered 2017-02-20: 50 ug via INTRAVENOUS

## 2017-02-20 MED ORDER — FENTANYL CITRATE (PF) 100 MCG/2ML IJ SOLN
INTRAMUSCULAR | Status: AC
  Filled 2017-02-20: qty 2

## 2017-02-20 MED ORDER — AMIODARONE HCL 150 MG/3ML IV SOLN
150.0000 mg | Freq: Once | INTRAVENOUS | Status: AC
  Administered 2017-02-20: 150 mg via INTRAVENOUS
  Filled 2017-02-20: qty 3

## 2017-02-20 MED ORDER — SODIUM CHLORIDE 0.9 % IV BOLUS (SEPSIS)
500.0000 mL | Freq: Once | INTRAVENOUS | Status: AC
  Administered 2017-02-20: 500 mL via INTRAVENOUS

## 2017-02-20 NOTE — ED Notes (Signed)
Patient cool and clammy.  MD at beside.  Patient hypotensive.

## 2017-02-20 NOTE — ED Triage Notes (Signed)
Chest pain that started yesterday with numbness in left arm. STates pain is worse and now having slurred speech that started at 2100. No other deficits noted.

## 2017-02-20 NOTE — ED Notes (Signed)
CODE STROKE CANCELLED

## 2017-02-20 NOTE — ED Provider Notes (Signed)
Emergency Department Provider Note  By signing my name below, I, Dora Sims, attest that this documentation has been prepared under the direction and in the presence of physician practitioner, Margette Fast, MD. Electronically Signed: Dora Sims, Scribe. 02/20/2017. 10:21 PM.  I have reviewed the triage vital signs and the nursing notes.   HISTORY  Chief Complaint Chest Pain and Aphasia  HPI Comments: Corey Carson is a 61 y.o. male who presents to the Emergency Department complaining of constant aphasia beginning around 9 PM tonight while at work. He endorses associated palpitations. Pt notes he developed some chest pain and left arm numbness last night that has persisted into today. He denies difficulty formulating thoughts. 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 Pt denies focal weakness or any other associated symptoms.  Level 5 caveat: stroke-like symptoms and somnolence  Past Medical History:  Diagnosis Date  . Asthma   . Cancer ([place])   . High cholesterol   . Hypertension    EKG 08/03/11 EPIC,   1 view chest 08/03/11 EPIC  . Sleep apnea    STOP BANG SCORE 5    Patient Active Problem List   Diagnosis Date Noted  . V tach ([place]) 02/20/2017  . Syncope 08/22/2014  . Atypical chest pain 08/02/2011  . HTN (hypertension) 08/02/2011    Past Surgical History:  Procedure Laterality Date  . APPENDECTOMY  2005  . CARDIAC CATHETERIZATION  07/2006   EPIC  . COLONOSCOPY  02/14/2012   Procedure: COLONOSCOPY;  Surgeon: Jamesetta So, MD;  [facility];  Service: Gastroenterology;  Laterality: N/A;  . HERNIA REPAIR     umbilical  . ROBOT ASSISTED LAPAROSCOPIC RADICAL PROSTATECTOMY  07/16/2012   Procedure: ROBOTIC ASSISTED LAPAROSCOPIC RADICAL PROSTATECTOMY LEVEL 3;  Surgeon: Dutch Gray, MD;  Location: [facility];  Service: Urology;  Laterality: N/A;          Allergies Sulfa antibiotics  History reviewed. No pertinent family history.  Social History Social  History  Substance Use Topics  . Smoking status: Never Smoker  . Smokeless tobacco: Never Used  . Alcohol use No    Review of Systems  Level 5 caveat: CVA symptoms and somnolence  ____________________________________________   PHYSICAL EXAM:  VITAL SIGNS: Pulse: 131 Resp: 23 BP: 130/90 SpO2: 100%   Constitutional: Appears somewhat drowsy with slurred speech noted.  Eyes: Conjunctivae are normal. PERRL. Head: Atraumatic. Nose: No congestion/rhinnorhea. Mouth/Throat: Mucous membranes are moist.  Neck: No stridor.  Cardiovascular: Tachycardia with frequent ectopy. Good peripheral circulation. Grossly normal heart sounds.   Respiratory: Normal respiratory effort.  No retractions. Lungs CTAB. Gastrointestinal: Soft and nontender. No distention.  Musculoskeletal: No lower extremity tenderness nor edema. No gross deformities of extremities. Neurologic: Slurred speech. No gross focal neurologic deficits are appreciated.  Skin:  Skin is warm, dry and intact. No rash noted.   ____________________________________________   LABS (all labs ordered are listed, but only abnormal results are displayed)  Labs Reviewed  COMPREHENSIVE METABOLIC PANEL - Abnormal; Notable for the following:       Result Value   CO2 21 (*)    Glucose, Bld 164 (*)    BUN 23 (*)    Calcium 8.7 (*)    Total Protein 6.4 (*)    All other components within normal limits  I-STAT CHEM 8, ED - Abnormal; Notable for the following:    BUN 23 (*)    Glucose, Bld 161 (*)    Calcium, Ion 1.07 (*)  All other components within normal limits  I-STAT TROPOININ, ED - Abnormal; Notable for the following:    Troponin i, poc 0.12 (*)    All other components within normal limits  ETHANOL  PROTIME-INR  APTT  CBC  DIFFERENTIAL  MAGNESIUM  RAPID URINE DRUG SCREEN, HOSP PERFORMED  URINALYSIS, ROUTINE W REFLEX MICROSCOPIC   ____________________________________________  EKG   EKG  Interpretation  Date/Time:  Monday February 20 2017 23:38:08 EDT Ventricular Rate:  86 PR Interval:    QRS Duration: 195 QT Interval:  430 QTC Calculation: 515 R Axis:   -111 Text Interpretation:  Sinus or ectopic atrial rhythm Ventricular premature complex Borderline prolonged PR interval Consider dextrocardia No STEMI.  Confirmed by Sherion Dooly MD, Toma Erichsen [phone]) on 02/21/2017 12:03:48 AM       ____________________________________________  RADIOLOGY  Ct Head Code Stroke W/o Cm  Result Date: 02/20/2017 CLINICAL DATA:  Code stroke. Initial evaluation for acute left arm numbness. EXAM: CT HEAD WITHOUT CONTRAST TECHNIQUE: Contiguous axial images were obtained from the base of the skull through the vertex without intravenous contrast. COMPARISON:  Prior CT from 08/27/2014. FINDINGS: Brain: Generalized cerebral atrophy with mild chronic small vessel ischemic disease. No acute intracranial hemorrhage. No evidence for acute large vessel territory infarct. No mass lesion, midline shift or mass effect. No hydrocephalus. No extra-axial fluid collection. Vascular: No hyperdense vessel. Scattered vascular calcifications noted within the carotid siphons. Skull: Scalp soft tissues demonstrate no acute abnormality. Calvarium intact. Sinuses/Orbits: Globes and orbital soft tissues within normal limits. Paranasal sinuses and mastoid air cells are clear. Other: None. ASPECTS [facility] Stroke Program Early CT Score) - Ganglionic level infarction (caudate, lentiform nuclei, internal capsule, insula, M1-M3 cortex): 7 - Supraganglionic infarction (M4-M6 cortex): 3 Total score (0-10 with 10 being normal): 10 IMPRESSION: 1. No acute intracranial infarct or other process identified. 2. ASPECTS is 10 3. Generalized age-related cerebral atrophy with mild chronic small vessel ischemic disease, progressed from 2015. Critical Value/emergent results were called by telephone at the time of interpretation on 02/20/2017 at 10:51 pm to Dr.  Nanda Quinton , who verbally acknowledged these results. Electronically Signed   By: Jeannine Boga M.D.   On: 02/20/2017 22:55    ____________________________________________   PROCEDURES  Procedure(s) performed:   Procedures  CRITICAL CARE Performed by: Margette Fast Total critical care time: 75 minutes Critical care time was exclusive of separately billable procedures and treating other patients. Critical care was necessary to treat or prevent imminent or life-threatening deterioration. Critical care was time spent personally by me on the following activities: development of treatment plan with patient and/or surrogate as well as nursing, discussions with consultants, evaluation of patient's response to treatment, examination of patient, obtaining history from patient or surrogate, ordering and performing treatments and interventions, ordering and review of laboratory studies, ordering and review of radiographic studies, pulse oximetry and re-evaluation of patient's condition.  Nanda Quinton, MD Emergency Medicine  ____________________________________________   INITIAL IMPRESSION / ASSESSMENT AND PLAN / ED COURSE  Pertinent labs & imaging results that were available during my care of the patient were reviewed by me and considered in my medical decision making (see chart for details).  Patient presents to the emergency department for evaluation of sudden onset slurred speech in the setting of chest pain since yesterday and left arm numbness yesterday. Patient states he was at work when this happened. On monitor he has a sinus tachycardia with right bundle. He does have notable slurred speech. No normal 1 hour  prior to ED presentation. Activated code stroke and sent for CT scan of the head. We'll give IV fluids and reassess.  10:55 PM Patient with more sustained runs of what appears to be ventricular tachycardia vs a-fib with abberancy. Up to 16 beats at a time. Patient continues  to have chest discomfort. He is awake and alert with normal blood pressure. I have placed a consult to cardiology previously and are awaiting their return phone call. In the interim I plan to give a 150 bolus of amiodarone and reassess. Patient with defibrillating pads on. Spoke with Neurology who recommends follow up MRI in the AM and CVA w/u after cleared from heart standpoint.   11:10 PM Spoke with Dr. Lavonna Monarch who accepts the patient in transfer. Agrees with Amiodarone bolus and infusion. After bolus patient's HR and runs of V-tach decreased. Patient remains awake and alert.   11:51 PM Called to bedside with some increased somnolence and hypotension. Continue IVF. Patient was given Fentanyl for pain previously which may be contributing. Will hold amiodarone infusion for now. Patient awakens and tells me that his CP is decreased. Able to follow commands. Temporary hold on Amiodarone until hypotension resolves. IVF bolus as above.   12:20 AM BP improved with IVF. No wide complex tachycardia on monitor.  ____________________________________________  FINAL CLINICAL IMPRESSION(S) / ED DIAGNOSES  Final diagnoses:  Unstable angina (HCC)  Wide-complex tachycardia (HCC)  Slurred speech  NSTEMI (non-ST elevated myocardial infarction) ([place])     MEDICATIONS GIVEN DURING THIS VISIT:  Medications  sodium chloride 0.9 % bolus 500 mL (0 mLs Intravenous Stopped 02/20/17 2308)    Followed by  0.9 %  sodium chloride infusion (100 mL/hr Intravenous Bolus from Bag 02/20/17 2309)  amiodarone (NEXTERONE PREMIX) 360-4.14 MG/200ML-% (1.8 mg/mL) IV infusion (60 mg/hr Intravenous Restarted 02/21/17 0050)  fentaNYL (SUBLIMAZE) 100 MCG/2ML injection (not administered)  amiodarone (CORDARONE) injection 150 mg (150 mg Intravenous Given 02/20/17 2300)  fentaNYL (SUBLIMAZE) injection 50 mcg (50 mcg Intravenous Given 02/20/17 2326)  aspirin chewable tablet 324 mg (324 mg Oral Given 02/20/17 2327)     NEW OUTPATIENT  MEDICATIONS STARTED DURING THIS VISIT:  None   Note:  This document was prepared using Dragon voice recognition software and may include unintentional dictation errors.  Nanda Quinton, MD Emergency Medicine  I personally performed the services described in this documentation, which was scribed in my presence. The recorded information has been reviewed and is accurate.      Margette Fast, MD 02/21/17 [phone]

## 2017-02-20 NOTE — H&P (Signed)
CARDIOLOGY ADMISSION NOTE  Patient ID: Corey Carson MRN: [id number] DOB/AGE: Nov 06, 1956 61 y.o.  Admit date: 02/20/2017 Primary Physician   Glo Herring., MD Primary Cardiologist   Dr. Bronson Ing Chief Complaint    Chest pain  HPI:  He presented to the ED at [facility] with an initial complaint of aphasia.  He said that this happened acutely.  He is able only to speak in a whisper.  However, there were no other apparent acute neurologic deficits.  Head CT demonstrated no acute changes.  He also complained of chest pain.  This apparently started yesterday and was under and around his left breast and sharp and stabbing.  He had numbness of his shoulder.  He did not have neck pain.  He reported some SOB and nausea.  He did not feel that his heart was racing.  However, he was tachycardic in the ED.  EKG showed tachycardia and wide complex beats.  He was reported by the ED to have runs of NSVT.  However, EKG on my review demonstrates atrial flutter.  There are wide complex beats which might represent aberrant beats.  Troponin poc was elevated.   The patient does not report PND or orthopnea but he was taking his father's Lasix because his legs have been swelling. Typically he feels well and can even play basketball although he has not done this for weeks.  He does work and climbs stairs and he gets tired with this but it does not appear that this is acute. He does feel light headed after climbing stairs.   The patient's past cardiac history includes syncope felt to be vasovagal.  He had a cardiac cath in 2007 with normal coronaries.  EF by echo in 2015 demonstrated a preserved EF with evidence of pulmonary HTN with some RV dysfunction although RVSP could not be estimated.  He was to follow with Dr. Luan Pulling as his pulmonologist although I don't have any office records and I see that he was a no show for PFTs.  He presented to cardiology at that time with atypical chest pain.  He is known to have a  RBBB.   Past Medical History:  Diagnosis Date  . Asthma   . Cancer [facility])    prostate  . High cholesterol   . Hypertension    EKG 08/03/11 EPIC,   1 view chest 08/03/11 EPIC  . Sleep apnea    STOP BANG SCORE 5.  No mask    Past Surgical History:  Procedure Laterality Date  . APPENDECTOMY  2005  . CARDIAC CATHETERIZATION  07/2006   EPIC  . COLONOSCOPY  02/14/2012   Procedure: COLONOSCOPY;  Surgeon: Jamesetta So, MD;  [facility];  Service: Gastroenterology;  Laterality: N/A;  . HERNIA REPAIR     umbilical  . ROBOT ASSISTED LAPAROSCOPIC RADICAL PROSTATECTOMY  07/16/2012   Procedure: ROBOTIC ASSISTED LAPAROSCOPIC RADICAL PROSTATECTOMY LEVEL 3;  Surgeon: Dutch Gray, MD;  Location: [facility];  Service: Urology;  Laterality: N/A;        Allergies  Allergen Reactions  . Sulfa Antibiotics Anaphylaxis   MEDS He is supposed to be on BP meds but does not take any.     Social History   Social History  . Marital status: Legally Separated    Spouse name: N/A  . Number of children: 2  . Years of education: N/A   Occupational History  . Makes boxes    Social History Main Topics  . Smoking status:  Never Smoker  . Smokeless tobacco: Never Used  . Alcohol use No  . Drug use: No  . Sexual activity: Not on file   Other Topics Concern  . Not on file   Social History Narrative  . No narrative on file    Family History  Problem Relation Age of Onset  . Deep vein thrombosis Mother     Died from complications of a blood clot  . CAD Father     CABG age 39     ROS:  As stated in the HPI and negative for all other systems.  Physical Exam: Blood pressure (!) 126/107, pulse (!) 126, temperature 97.7 F (36.5 C), temperature source Oral, resp. rate (!) 29, height 5\' 7"  (1.702 m), weight 256 lb 3.2 oz (116.2 kg), SpO2 99 %.  GENERAL:  Well appearing HEENT:  Pupils equal round and reactive, fundi not visualized, oral mucosa unremarkable NECK:  Positive jugular venous  distention, waveform within normal limits, carotid upstroke brisk and symmetric, no bruits, no thyromegaly LYMPHATICS:  No cervical, inguinal adenopathy LUNGS:  Clear to auscultation bilaterally BACK:  No CVA tenderness CHEST:  Unremarkable HEART:  PMI not displaced or sustained,S1 and S2 within normal limits, no S3, no S4, no clicks, no rubs, no murmurs, distant heart sounds ABD:  Flat, positive bowel sounds normal in frequency in pitch, no bruits, no rebound, no guarding, no midline pulsatile mass, positive hepatomegaly, no splenomegaly EXT:  2 plus pulses throughout, mild bilateral leg edema, no cyanosis no clubbing SKIN:  No rashes no nodules NEURO:  Cranial nerves II through XII grossly intact, motor grossly intact throughout, speaks only in a whisper PSYCH:  Cognitively intact, oriented to person place and time  Labs: Lab Results  Component Value Date   BUN 23 (H) 02/20/2017   Lab Results  Component Value Date   CREATININE 1.00 02/20/2017   Lab Results  Component Value Date   NA 141 02/20/2017   K 3.6 02/20/2017   CL 105 02/20/2017   CO2 21 (L) 02/20/2017   Lab Results  Component Value Date   TROPONINI <0.30 07/20/2012   Lab Results  Component Value Date   WBC 9.2 02/20/2017   HGB 17.0 02/20/2017   HCT 50.0 02/20/2017   MCV 84.5 02/20/2017   PLT 260 02/20/2017   No results found for: CHOL, HDL, LDLCALC, LDLDIRECT, TRIG, CHOLHDL Lab Results  Component Value Date   ALT 54 02/20/2017   AST 37 02/20/2017   ALKPHOS 69 02/20/2017   BILITOT 1.0 02/20/2017     Radiology:  CXR:  Pending   EKG:   Atrial flutter with ventricular rate 86 and RBBB.  02/20/17  ASSESSMENT AND PLAN:    ATRIAL FLUTTER:  The onset of this is not clear.  I will avoid amiodarone which was started in the ED for possible VTach.  Start heparin.  Rate control vs TEE/DCCV this admission.  PULMONARY HTN:  This was suggested on a previous echo and by exam today.  I will check a follow up  echocardiogram.  Further evaluation will be based on this.   HYPERGLYCEMIA:  I suspect that he has DM and will check an A1C to start with.    SLEEP APNEA:   He reports no mask.  I suspect that compliance would be an issue but he will need follow up of this as an outpatient.   HTN:  He is supposed to be taking BP meds but is not.  I will start  divided dose Cardizem PO tonight but this might need to be changed prior to discharge pending the work up above.  CHEST PAIN:  Atypical.  Cycle enzymes.  I suspect he will be able to have a non invasive ischemia work up.    SignedMinus Breeding 02/21/2017, 3:22 AM

## 2017-02-20 NOTE — ED Notes (Signed)
Patient to CT.

## 2017-02-20 NOTE — ED Notes (Signed)
[place] paged @ [phone] [facility] notified @ [phone]

## 2017-02-21 ENCOUNTER — Encounter (HOSPITAL_COMMUNITY): Payer: Self-pay

## 2017-02-21 ENCOUNTER — Inpatient Hospital Stay (HOSPITAL_COMMUNITY): Payer: BLUE CROSS/BLUE SHIELD

## 2017-02-21 DIAGNOSIS — R079 Chest pain, unspecified: Secondary | ICD-10-CM

## 2017-02-21 DIAGNOSIS — I4892 Unspecified atrial flutter: Secondary | ICD-10-CM | POA: Diagnosis present

## 2017-02-21 LAB — URINALYSIS, ROUTINE W REFLEX MICROSCOPIC
Bilirubin Urine: NEGATIVE
GLUCOSE, UA: NEGATIVE mg/dL
HGB URINE DIPSTICK: NEGATIVE
Ketones, ur: NEGATIVE mg/dL
LEUKOCYTES UA: NEGATIVE
NITRITE: NEGATIVE
PH: 5 (ref 5.0–8.0)
PROTEIN: 30 mg/dL — AB
Specific Gravity, Urine: 1.025 (ref 1.005–1.030)

## 2017-02-21 LAB — RAPID URINE DRUG SCREEN, HOSP PERFORMED
AMPHETAMINES: NOT DETECTED
BARBITURATES: NOT DETECTED
Benzodiazepines: NOT DETECTED
Cocaine: NOT DETECTED
Opiates: NOT DETECTED
Tetrahydrocannabinol: NOT DETECTED

## 2017-02-21 LAB — LIPID PANEL
CHOLESTEROL: 175 mg/dL (ref 0–200)
HDL: 35 mg/dL — ABNORMAL LOW (ref >40)
LDL CALC: 118 mg/dL — AB (ref 0–99)
TRIGLYCERIDES: 108 mg/dL (ref <150)
Total CHOL/HDL Ratio: 5 RATIO
VLDL: 22 mg/dL (ref 0–40)

## 2017-02-21 LAB — BASIC METABOLIC PANEL
Anion gap: 12 (ref 5–15)
BUN: 22 mg/dL — ABNORMAL HIGH (ref 6–20)
CALCIUM: 8.4 mg/dL — AB (ref 8.9–10.3)
CO2: 20 mmol/L — ABNORMAL LOW (ref 22–32)
CREATININE: 1.07 mg/dL (ref 0.61–1.24)
Chloride: 104 mmol/L (ref 101–111)
GFR calc non Af Amer: >60 mL/min (ref >60)
Glucose, Bld: 186 mg/dL — ABNORMAL HIGH (ref 65–99)
Potassium: 4.4 mmol/L (ref 3.5–5.1)
SODIUM: 136 mmol/L (ref 135–145)

## 2017-02-21 LAB — HEPARIN LEVEL (UNFRACTIONATED)
Heparin Unfractionated: 0.18 IU/mL — ABNORMAL LOW (ref 0.30–0.70)
Heparin Unfractionated: 0.62 IU/mL (ref 0.30–0.70)

## 2017-02-21 LAB — BRAIN NATRIURETIC PEPTIDE: B Natriuretic Peptide: 812.3 pg/mL — ABNORMAL HIGH (ref 0.0–100.0)

## 2017-02-21 LAB — HIV ANTIBODY (ROUTINE TESTING W REFLEX): HIV Screen 4th Generation wRfx: NONREACTIVE

## 2017-02-21 LAB — TROPONIN I
TROPONIN I: 0.16 ng/mL — AB (ref <0.03)
TROPONIN I: 0.19 ng/mL — AB (ref <0.03)
Troponin I: 0.15 ng/mL (ref <0.03)

## 2017-02-21 LAB — MRSA PCR SCREENING: MRSA BY PCR: NEGATIVE

## 2017-02-21 LAB — TSH: TSH: 2.094 u[IU]/mL (ref 0.350–4.500)

## 2017-02-21 MED ORDER — HEPARIN BOLUS VIA INFUSION
5000.0000 [IU] | Freq: Once | INTRAVENOUS | Status: AC
  Administered 2017-02-21: 5000 [IU] via INTRAVENOUS
  Filled 2017-02-21: qty 5000

## 2017-02-21 MED ORDER — LOPERAMIDE HCL 2 MG PO CAPS
2.0000 mg | ORAL_CAPSULE | ORAL | Status: DC | PRN
  Administered 2017-02-21: 2 mg via ORAL
  Filled 2017-02-21: qty 1

## 2017-02-21 MED ORDER — METOPROLOL TARTRATE 25 MG PO TABS
25.0000 mg | ORAL_TABLET | Freq: Four times a day (QID) | ORAL | Status: DC
  Administered 2017-02-21 – 2017-02-22 (×7): 25 mg via ORAL
  Filled 2017-02-21 (×7): qty 1

## 2017-02-21 MED ORDER — ONDANSETRON HCL 4 MG/2ML IJ SOLN
4.0000 mg | Freq: Four times a day (QID) | INTRAMUSCULAR | Status: DC | PRN
  Administered 2017-02-21 (×2): 4 mg via INTRAVENOUS
  Filled 2017-02-21 (×2): qty 2

## 2017-02-21 MED ORDER — ALUM & MAG HYDROXIDE-SIMETH 200-200-20 MG/5ML PO SUSP
15.0000 mL | ORAL | Status: DC | PRN
  Administered 2017-02-21: 15 mL via ORAL
  Filled 2017-02-21: qty 30

## 2017-02-21 MED ORDER — HEPARIN BOLUS VIA INFUSION
3000.0000 [IU] | Freq: Once | INTRAVENOUS | Status: AC
  Administered 2017-02-21: 3000 [IU] via INTRAVENOUS
  Filled 2017-02-21: qty 3000

## 2017-02-21 MED ORDER — ACETAMINOPHEN 325 MG PO TABS
650.0000 mg | ORAL_TABLET | ORAL | Status: DC | PRN
  Administered 2017-02-21 – 2017-03-01 (×7): 650 mg via ORAL
  Filled 2017-02-21 (×7): qty 2

## 2017-02-21 MED ORDER — HEPARIN (PORCINE) IN NACL 100-0.45 UNIT/ML-% IJ SOLN
1600.0000 [IU]/h | INTRAMUSCULAR | Status: DC
  Administered 2017-02-21: 1400 [IU]/h via INTRAVENOUS
  Administered 2017-02-21 – 2017-02-22 (×2): 1700 [IU]/h via INTRAVENOUS
  Filled 2017-02-21 (×3): qty 250

## 2017-02-21 MED ORDER — FUROSEMIDE 10 MG/ML IJ SOLN
40.0000 mg | Freq: Once | INTRAMUSCULAR | Status: AC
  Administered 2017-02-21: 40 mg via INTRAVENOUS
  Filled 2017-02-21: qty 4

## 2017-02-21 MED ORDER — ASPIRIN 81 MG PO CHEW
81.0000 mg | CHEWABLE_TABLET | Freq: Every day | ORAL | Status: DC
  Administered 2017-02-21 – 2017-02-22 (×2): 81 mg via ORAL
  Filled 2017-02-21 (×2): qty 1

## 2017-02-21 MED ORDER — DILTIAZEM HCL 30 MG PO TABS
30.0000 mg | ORAL_TABLET | Freq: Four times a day (QID) | ORAL | Status: DC
  Administered 2017-02-21 – 2017-02-22 (×6): 30 mg via ORAL
  Filled 2017-02-21 (×6): qty 1

## 2017-02-21 NOTE — Progress Notes (Signed)
Pt agreed to be in camera room.

## 2017-02-21 NOTE — Progress Notes (Signed)
Progress Note  Patient Name: Corey Carson Date of Encounter: 02/21/2017  Primary Cardiologist: Bronson Ing    Subjective   NO chest tightness.  Breathing still not optimal    Inpatient Medications    Scheduled Meds: . aspirin  81 mg Oral Daily  . diltiazem  30 mg Oral Q6H  . fentaNYL       Continuous Infusions: . sodium chloride 100 mL/hr (02/21/17 0556)  . heparin 1,400 Units/hr (02/21/17 0700)   PRN Meds: acetaminophen, ondansetron (ZOFRAN) IV   Vital Signs    Vitals:   02/21/17 0553 02/21/17 0554 02/21/17 0556 02/21/17 0700  BP: (!) 131/101 (!) 111/101 (!) 111/101 (!) 115/98  Pulse: 97 (!) 38  (!) 37  Resp: 14 16 19  (!) 23  Temp:  98 F (36.7 C)    TempSrc:  Oral    SpO2: 99% 100%  94%  Weight:      Height:        Intake/Output Summary (Last 24 hours) at 02/21/17 0849 Last data filed at 02/21/17 0700  Gross per 24 hour  Intake          1047.29 ml  Output              250 ml  Net           797.29 ml   Filed Weights   02/20/17 2224 02/21/17 0100  Weight: 250 lb (113.4 kg) 256 lb 3.2 oz (116.2 kg)    Telemetry    Atrial flutter now atrial fib  90s to 100s    - Personally Reviewed  ECG      Physical Exam  Obese 61 yo in NAD   GEN: No acute distress.   Neck:  JVP is mildly increased   Cardiac:  Irreg irreg  , no murmurs, rubs, or gallops.  Respiratory: Clear to auscultation bilaterally. GI: Soft, nontender, non-distended  MS: Tr to 1+   edema; No deformity. Neuro:  Nonfocal  Psych: Normal affect   Labs    Chemistry Recent Labs Lab 02/20/17 2235 02/20/17 2243 02/21/17 0355  NA 136 141 136  K 3.6 3.6 4.4  CL 105 105 104  CO2 21*  --  20*  GLUCOSE 164* 161* 186*  BUN 23* 23* 22*  CREATININE 1.01 1.00 1.07  CALCIUM 8.7*  --  8.4*  PROT 6.4*  --   --   ALBUMIN 3.7  --   --   AST 37  --   --   ALT 54  --   --   ALKPHOS 69  --   --   BILITOT 1.0  --   --   GFRNONAA >60  --  >60  GFRAA >60  --  >60  ANIONGAP 10  --  12      Hematology Recent Labs Lab 02/20/17 2235 02/20/17 2243  WBC 9.2  --   RBC 5.43  --   HGB 15.4 17.0  HCT 45.9 50.0  MCV 84.5  --   MCH 28.4  --   MCHC 33.6  --   RDW 13.6  --   PLT 260  --     Cardiac Enzymes Recent Labs Lab 02/21/17 0355  TROPONINI 0.15*    Recent Labs Lab 02/20/17 2241  TROPIPOC 0.12*     BNP Recent Labs Lab 02/21/17 0355  BNP 812.3*     DDimer No results for input(s): DDIMER in the last 168 hours.   Radiology  Dg Chest 2 View  Result Date: 02/21/2017 CLINICAL DATA:  Right side chest pain. EXAM: CHEST  2 VIEW COMPARISON:  05/19/2013 FINDINGS: Cardiomegaly. Lungs are clear. No effusions. No acute bony abnormality. IMPRESSION: Cardiomegaly.  No active disease. Electronically Signed   By: Rolm Baptise M.D.   On: 02/21/2017 08:25   Ct Head Code Stroke W/o Cm  Result Date: 02/20/2017 CLINICAL DATA:  Code stroke. Initial evaluation for acute left arm numbness. EXAM: CT HEAD WITHOUT CONTRAST TECHNIQUE: Contiguous axial images were obtained from the base of the skull through the vertex without intravenous contrast. COMPARISON:  Prior CT from 08/27/2014. FINDINGS: Brain: Generalized cerebral atrophy with mild chronic small vessel ischemic disease. No acute intracranial hemorrhage. No evidence for acute large vessel territory infarct. No mass lesion, midline shift or mass effect. No hydrocephalus. No extra-axial fluid collection. Vascular: No hyperdense vessel. Scattered vascular calcifications noted within the carotid siphons. Skull: Scalp soft tissues demonstrate no acute abnormality. Calvarium intact. Sinuses/Orbits: Globes and orbital soft tissues within normal limits. Paranasal sinuses and mastoid air cells are clear. Other: None. ASPECTS [facility] Stroke Program Early CT Score) - Ganglionic level infarction (caudate, lentiform nuclei, internal capsule, insula, M1-M3 cortex): 7 - Supraganglionic infarction (M4-M6 cortex): 3 Total score (0-10 with 10  being normal): 10 IMPRESSION: 1. No acute intracranial infarct or other process identified. 2. ASPECTS is 10 3. Generalized age-related cerebral atrophy with mild chronic small vessel ischemic disease, progressed from 2015. Critical Value/emergent results were called by telephone at the time of interpretation on 02/20/2017 at 10:51 pm to Dr. Nanda Quinton , who verbally acknowledged these results. Electronically Signed   By: Jeannine Boga M.D.   On: 02/20/2017 22:55    Cardiac Studies   Echo pending    Patient Profile       Assessment & Plan    1  Atrial flutter/ atrial flutter  Still not optimized  Echo pending    If LVEF normal consider cardioversion    2  CP  Cath normal in 2007   Pt has had chest tightness with activity and SOB with activity for years  Hard to say if different  Does not appear to be that active   Echo to eval   If LVEF down consider cath prior to anticoagulation    3  HTN  Diastolics are high  WIl work with meds  Would also give IV lasix x 1 and follow response    4  Pulmonary HTN    Assess on echo    5  Sleep apnea  Will need to review    6  Glu  A1C pending    Signed, Dorris Carnes, MD  02/21/2017, 8:49 AM

## 2017-02-21 NOTE — Progress Notes (Signed)
ANTICOAGULATION CONSULT NOTE - Follow Up Consult  Pharmacy Consult for Heparin Indication: atrial fibrillation  Allergies  Allergen Reactions  . Sulfa Antibiotics Anaphylaxis    Patient Measurements: Height: 5\' 7"  (170.2 cm) Weight: 256 lb 3.2 oz (116.2 kg) IBW/kg (Calculated) : 66.1 Heparin Dosing Weight: 94 kg  Vital Signs: Temp: 98 F (36.7 C) (03/20 0554) Temp Source: Oral (03/20 0554) BP: 115/98 (03/20 0700) Pulse Rate: 67 (03/20 1400)  Labs:  Recent Labs  02/20/17 2235 02/20/17 2243 02/21/17 0355 02/21/17 0918 02/21/17 1254  HGB 15.4 17.0  --   --   --   HCT 45.9 50.0  --   --   --   PLT 260  --   --   --   --   APTT 27  --   --   --   --   LABPROT 14.0  --   --   --   --   INR 1.08  --   --   --   --   HEPARINUNFRC  --   --   --   --  0.18*  CREATININE 1.01 1.00 1.07  --   --   TROPONINI  --   --  0.15* 0.16*  --     Estimated Creatinine Clearance: 88.3 mL/min (by C-G formula based on SCr of 1.07 mg/dL).  Assessment:  61 yr old male on heparin for afib/flutter.  Initial heparin level is subtherapeutic (0.18) on 1400 units/hr.  Goal of Therapy:  Heparin level 0.3-0.7 units/ml Monitor platelets by anticoagulation protocol: Yes   Plan:   Heparin 3000 units IV x 1 bolus.  Increase heparin drip to 1700 units/hr.  Heparin level ~6 hrs after rate change.  Daily heparin level and CBC.  Arty Baumgartner, [place] Pager: [phone] 02/21/2017,2:32 PM

## 2017-02-21 NOTE — Progress Notes (Signed)
Code stroke Phone call from [place] [facility] Radiologist   [phone]

## 2017-02-21 NOTE — Progress Notes (Signed)
ANTICOAGULATION CONSULT NOTE - Initial Consult  Pharmacy Consult for Heparin  Indication: Atrial flutter  Allergies  Allergen Reactions  . Sulfa Antibiotics Anaphylaxis   Patient Measurements: Height: 5\' 7"  (170.2 cm) Weight: 256 lb 3.2 oz (116.2 kg) IBW/kg (Calculated) : 66.1  Vital Signs: Temp: 97.7 F (36.5 C) (03/20 0100) Temp Source: Oral (03/20 0100) BP: 126/107 (03/20 0307) Pulse Rate: 126 (03/20 0307)  Labs:  Recent Labs  02/20/17 2235 02/20/17 2243  HGB 15.4 17.0  HCT 45.9 50.0  PLT 260  --   APTT 27  --   LABPROT 14.0  --   INR 1.08  --   CREATININE 1.01 1.00    Estimated Creatinine Clearance: 94.5 mL/min (by C-G formula based on SCr of 1 mg/dL).   Medical History: Past Medical History:  Diagnosis Date  . Asthma   . Cancer [facility])    prostate  . High cholesterol   . Hypertension    EKG 08/03/11 EPIC,   1 view chest 08/03/11 EPIC  . Sleep apnea    STOP BANG SCORE 5.  No mask    Assessment: 61 y/o M presents to ED with slurred speech/CP, found to be in atrial flutter, starting heparin per pharmacy, CBC good, renal function good, pt states no meds taken in over a year.   Goal of Therapy:  Heparin level 0.3-0.7 units/ml Monitor platelets by anticoagulation protocol: Yes   Plan:  Heparin 5000 units BOLUS Start heparin drip at 1400 units/hr 1300 HL Daily CBC/HL Monitor for bleeding   Narda Bonds 02/21/2017,3:53 AM

## 2017-02-21 NOTE — Progress Notes (Signed)
ANTICOAGULATION CONSULT NOTE - FOLLOW UP    HL = 0.62 (goal 0.3 - 0.7 units/mL) Heparin dosing weight = 94 kg   Assessment: 60 YOM continues on IV heparin for Afib/Aflutter.  Heparin level is therapeutic post rate adjustment.  No bleeding reported.   Plan: - Continue heparin gtt at 1700 units/hr - F/U AM labs    Cinthia Rodden D. Mina Marble, PharmD, BCPS 02/21/2017, 9:43 PM

## 2017-02-22 ENCOUNTER — Encounter (HOSPITAL_COMMUNITY)
Admission: EM | Disposition: A | Discharge status: Home / Self Care | Payer: Self-pay | Source: Home / Self Care | Attending: Cardiology

## 2017-02-22 ENCOUNTER — Inpatient Hospital Stay (HOSPITAL_COMMUNITY): Payer: BLUE CROSS/BLUE SHIELD

## 2017-02-22 DIAGNOSIS — I2510 Atherosclerotic heart disease of native coronary artery without angina pectoris: Secondary | ICD-10-CM

## 2017-02-22 DIAGNOSIS — I4892 Unspecified atrial flutter: Secondary | ICD-10-CM

## 2017-02-22 DIAGNOSIS — I214 Non-ST elevation (NSTEMI) myocardial infarction: Secondary | ICD-10-CM

## 2017-02-22 DIAGNOSIS — I5021 Acute systolic (congestive) heart failure: Secondary | ICD-10-CM

## 2017-02-22 HISTORY — PX: RIGHT/LEFT HEART CATH AND CORONARY ANGIOGRAPHY: CATH118266

## 2017-02-22 LAB — POCT I-STAT 3, VENOUS BLOOD GAS (G3P V)
Acid-base deficit: 6 mmol/L — ABNORMAL HIGH (ref 0.0–2.0)
Acid-base deficit: 6 mmol/L — ABNORMAL HIGH (ref 0.0–2.0)
Acid-base deficit: 7 mmol/L — ABNORMAL HIGH (ref 0.0–2.0)
BICARBONATE: 19.6 mmol/L — AB (ref 20.0–28.0)
Bicarbonate: 19.1 mmol/L — ABNORMAL LOW (ref 20.0–28.0)
Bicarbonate: 19.3 mmol/L — ABNORMAL LOW (ref 20.0–28.0)
O2 SAT: 46 %
O2 Saturation: 45 %
O2 Saturation: 48 %
PCO2 VEN: 38 mmHg — AB (ref 44.0–60.0)
PCO2 VEN: 38.9 mmHg — AB (ref 44.0–60.0)
PO2 VEN: 27 mmHg — AB (ref 32.0–45.0)
TCO2: 20 mmol/L (ref 0–100)
TCO2: 20 mmol/L (ref 0–100)
TCO2: 21 mmol/L (ref 0–100)
pCO2, Ven: 38.2 mmHg — ABNORMAL LOW (ref 44.0–60.0)
pH, Ven: 7.31 (ref 7.250–7.430)
pH, Ven: 7.311 (ref 7.250–7.430)
pH, Ven: 7.311 (ref 7.250–7.430)
pO2, Ven: 27 mmHg — CL (ref 32.0–45.0)
pO2, Ven: 28 mmHg — CL (ref 32.0–45.0)

## 2017-02-22 LAB — POCT I-STAT 3, ART BLOOD GAS (G3+)
Acid-base deficit: 8 mmol/L — ABNORMAL HIGH (ref 0.0–2.0)
BICARBONATE: 17.7 mmol/L — AB (ref 20.0–28.0)
O2 Saturation: 95 %
PH ART: 7.31 — AB (ref 7.350–7.450)
PO2 ART: 84 mmHg (ref 83.0–108.0)
TCO2: 19 mmol/L (ref 0–100)
pCO2 arterial: 35 mmHg (ref 32.0–48.0)

## 2017-02-22 LAB — CBC
HCT: 48.9 % (ref 39.0–52.0)
Hemoglobin: 16.1 g/dL (ref 13.0–17.0)
MCH: 28.3 pg (ref 26.0–34.0)
MCHC: 32.9 g/dL (ref 30.0–36.0)
MCV: 85.9 fL (ref 78.0–100.0)
Platelets: 284 10*3/uL (ref 150–400)
RBC: 5.69 MIL/uL (ref 4.22–5.81)
RDW: 13.7 % (ref 11.5–15.5)
WBC: 12.3 10*3/uL — ABNORMAL HIGH (ref 4.0–10.5)

## 2017-02-22 LAB — ECHOCARDIOGRAM COMPLETE
HEIGHTINCHES: 67 in
WEIGHTICAEL: 4152 [oz_av]

## 2017-02-22 LAB — COOXEMETRY PANEL
CARBOXYHEMOGLOBIN: 1 % (ref 0.5–1.5)
Methemoglobin: 1 % (ref 0.0–1.5)
O2 SAT: 48.9 %
Total hemoglobin: 14.5 g/dL (ref 12.0–16.0)

## 2017-02-22 LAB — HEMOGLOBIN A1C
Hgb A1c MFr Bld: 7.3 % — ABNORMAL HIGH (ref 4.8–5.6)
Mean Plasma Glucose: 163 mg/dL

## 2017-02-22 LAB — POCT ACTIVATED CLOTTING TIME: Activated Clotting Time: 114 seconds

## 2017-02-22 LAB — HEPARIN LEVEL (UNFRACTIONATED): HEPARIN UNFRACTIONATED: 0.67 [IU]/mL (ref 0.30–0.70)

## 2017-02-22 SURGERY — RIGHT/LEFT HEART CATH AND CORONARY ANGIOGRAPHY
Anesthesia: LOCAL

## 2017-02-22 MED ORDER — SODIUM CHLORIDE 0.9 % IV SOLN: 250.0000 mL | INTRAVENOUS | Status: DC | PRN

## 2017-02-22 MED ORDER — MILRINONE LACTATE IN DEXTROSE 20-5 MG/100ML-% IV SOLN
0.3750 ug/kg/min | INTRAVENOUS | Status: DC
  Administered 2017-02-22 – 2017-02-26 (×8): 0.25 ug/kg/min via INTRAVENOUS
  Administered 2017-02-26: 0.375 ug/kg/min via INTRAVENOUS
  Administered 2017-02-26: 0.25 ug/kg/min via INTRAVENOUS
  Administered 2017-02-27 – 2017-02-28 (×3): 0.375 ug/kg/min via INTRAVENOUS
  Filled 2017-02-22 (×15): qty 100

## 2017-02-22 MED ORDER — MILRINONE LACTATE IN DEXTROSE 20-5 MG/100ML-% IV SOLN: 0.2500 ug/kg/min | INTRAVENOUS | Status: DC

## 2017-02-22 MED ORDER — FENTANYL CITRATE (PF) 100 MCG/2ML IJ SOLN
INTRAMUSCULAR | Status: DC | PRN
  Administered 2017-02-22: 25 ug via INTRAVENOUS

## 2017-02-22 MED ORDER — SODIUM CHLORIDE 0.9% FLUSH: 3.0000 mL | INTRAVENOUS | Status: DC | PRN

## 2017-02-22 MED ORDER — AMIODARONE HCL IN DEXTROSE 360-4.14 MG/200ML-% IV SOLN: 30.0000 mg/h | INTRAVENOUS | Status: DC

## 2017-02-22 MED ORDER — POLYETHYLENE GLYCOL 3350 17 G PO PACK
17.0000 g | PACK | Freq: Every day | ORAL | Status: DC
  Administered 2017-02-24 – 2017-02-27 (×4): 17 g via ORAL
  Filled 2017-02-22 (×8): qty 1

## 2017-02-22 MED ORDER — FENTANYL CITRATE (PF) 100 MCG/2ML IJ SOLN
INTRAMUSCULAR | Status: AC
  Filled 2017-02-22: qty 2

## 2017-02-22 MED ORDER — AMIODARONE HCL IN DEXTROSE 360-4.14 MG/200ML-% IV SOLN
30.0000 mg/h | INTRAVENOUS | Status: DC
  Administered 2017-02-22 – 2017-03-01 (×12): 30 mg/h via INTRAVENOUS
  Filled 2017-02-22 (×13): qty 200

## 2017-02-22 MED ORDER — LIDOCAINE HCL (PF) 1 % IJ SOLN
INTRAMUSCULAR | Status: DC | PRN
  Administered 2017-02-22: 20 mL

## 2017-02-22 MED ORDER — APIXABAN 5 MG PO TABS
5.0000 mg | ORAL_TABLET | Freq: Two times a day (BID) | ORAL | Status: DC
  Administered 2017-02-23 – 2017-03-03 (×18): 5 mg via ORAL
  Filled 2017-02-22 (×18): qty 1

## 2017-02-22 MED ORDER — SODIUM CHLORIDE 0.9% FLUSH: 10.0000 mL | INTRAVENOUS | Status: DC | PRN

## 2017-02-22 MED ORDER — SODIUM CHLORIDE 0.9% FLUSH: 3.0000 mL | Freq: Two times a day (BID) | INTRAVENOUS | Status: DC

## 2017-02-22 MED ORDER — FUROSEMIDE 10 MG/ML IJ SOLN
80.0000 mg | Freq: Three times a day (TID) | INTRAMUSCULAR | Status: DC
  Administered 2017-02-22 – 2017-02-25 (×8): 80 mg via INTRAVENOUS
  Filled 2017-02-22 (×8): qty 8

## 2017-02-22 MED ORDER — IOPAMIDOL (ISOVUE-370) INJECTION 76%
INTRAVENOUS | Status: DC | PRN
  Administered 2017-02-22: 140 mL via INTRA_ARTERIAL

## 2017-02-22 MED ORDER — MIDAZOLAM HCL 2 MG/2ML IJ SOLN
INTRAMUSCULAR | Status: DC | PRN
  Administered 2017-02-22: 1 mg via INTRAVENOUS

## 2017-02-22 MED ORDER — BISACODYL 10 MG RE SUPP
10.0000 mg | Freq: Every day | RECTAL | Status: DC | PRN
  Administered 2017-02-22: 10 mg via RECTAL
  Filled 2017-02-22: qty 1

## 2017-02-22 MED ORDER — ASPIRIN 81 MG PO CHEW: 81.0000 mg | CHEWABLE_TABLET | ORAL | Status: AC

## 2017-02-22 MED ORDER — IOPAMIDOL (ISOVUE-370) INJECTION 76%
INTRAVENOUS | Status: AC
  Filled 2017-02-22: qty 100

## 2017-02-22 MED ORDER — PERFLUTREN LIPID MICROSPHERE
1.0000 mL | INTRAVENOUS | Status: AC | PRN
  Administered 2017-02-22: 2 mL via INTRAVENOUS
  Filled 2017-02-22: qty 10

## 2017-02-22 MED ORDER — HEPARIN (PORCINE) IN NACL 2-0.9 UNIT/ML-% IJ SOLN
INTRAMUSCULAR | Status: AC
  Filled 2017-02-22: qty 1000

## 2017-02-22 MED ORDER — SODIUM CHLORIDE 0.9 % IV SOLN: INTRAVENOUS | Status: DC

## 2017-02-22 MED ORDER — HEPARIN (PORCINE) IN NACL 2-0.9 UNIT/ML-% IJ SOLN
INTRAMUSCULAR | Status: DC | PRN
  Administered 2017-02-22: 1000 mL

## 2017-02-22 MED ORDER — MIDAZOLAM HCL 2 MG/2ML IJ SOLN
INTRAMUSCULAR | Status: AC
  Filled 2017-02-22: qty 2

## 2017-02-22 MED ORDER — IOPAMIDOL (ISOVUE-370) INJECTION 76%
INTRAVENOUS | Status: AC
  Filled 2017-02-22: qty 50

## 2017-02-22 MED ORDER — AMIODARONE HCL IN DEXTROSE 360-4.14 MG/200ML-% IV SOLN
60.0000 mg/h | INTRAVENOUS | Status: AC
  Administered 2017-02-22: 60 mg/h via INTRAVENOUS

## 2017-02-22 MED ORDER — LIDOCAINE HCL (PF) 1 % IJ SOLN
INTRAMUSCULAR | Status: AC
Start: 2017-02-22 — End: 2017-02-22
  Filled 2017-02-22: qty 30

## 2017-02-22 MED ORDER — SODIUM CHLORIDE 0.9% FLUSH
10.0000 mL | Freq: Two times a day (BID) | INTRAVENOUS | Status: DC
  Administered 2017-02-22 – 2017-02-23 (×2): 10 mL
  Administered 2017-02-23: 20 mL
  Administered 2017-02-24 – 2017-03-02 (×6): 10 mL

## 2017-02-22 MED ORDER — AMIODARONE HCL IN DEXTROSE 360-4.14 MG/200ML-% IV SOLN
60.0000 mg/h | INTRAVENOUS | Status: DC
  Filled 2017-02-22: qty 200

## 2017-02-22 MED ORDER — FUROSEMIDE 10 MG/ML IJ SOLN: 80.0000 mg | Freq: Two times a day (BID) | INTRAMUSCULAR | Status: DC

## 2017-02-22 SURGICAL SUPPLY — 14 items
CATH INFINITI 5 FR AL2 (CATHETERS) ×2 IMPLANT
CATH INFINITI 5FR MULTPACK ANG (CATHETERS) ×2 IMPLANT
CATH SWAN GANZ 7F STRAIGHT (CATHETERS) ×4 IMPLANT
HOVERMATT SINGLE USE (MISCELLANEOUS) ×2 IMPLANT
KIT HEART LEFT (KITS) ×2 IMPLANT
KIT HEART RIGHT NAMIC (KITS) ×2 IMPLANT
PACK CARDIAC CATHETERIZATION (CUSTOM PROCEDURE TRAY) ×2 IMPLANT
SHEATH PINNACLE 5F 10CM (SHEATH) ×2 IMPLANT
SHEATH PINNACLE 7F 10CM (SHEATH) ×2 IMPLANT
SYR MEDRAD MARK V 150ML (SYRINGE) ×2 IMPLANT
TRANSDUCER W/STOPCOCK (MISCELLANEOUS) ×2 IMPLANT
TUBING CIL FLEX 10 FLL-RA (TUBING) ×2 IMPLANT
WIRE EMERALD 3MM-J .025X260CM (WIRE) ×2 IMPLANT
WIRE EMERALD 3MM-J .035X150CM (WIRE) ×2 IMPLANT

## 2017-02-22 NOTE — Progress Notes (Signed)
ANTICOAGULATION CONSULT NOTE - Follow Up Consult  Pharmacy Consult for Heparin Indication: atrial fibrillation  Allergies  Allergen Reactions  . Sulfa Antibiotics Anaphylaxis    Patient Measurements: Height: 5\' 7"  (170.2 cm) Weight: 259 lb 8 oz (117.7 kg) IBW/kg (Calculated) : 66.1  Vital Signs: Temp: 98.4 F (36.9 C) (03/21 1140) Temp Source: Oral (03/21 1140) BP: 128/95 (03/21 1231) Pulse Rate: 75 (03/21 1140)  Labs:  Recent Labs  02/20/17 2235 02/20/17 2243 02/21/17 0355 02/21/17 0918 02/21/17 1254 02/21/17 1550 02/21/17 2045 02/22/17 0622  HGB 15.4 17.0  --   --   --   --   --  16.1  HCT 45.9 50.0  --   --   --   --   --  48.9  PLT 260  --   --   --   --   --   --  284  APTT 27  --   --   --   --   --   --   --   LABPROT 14.0  --   --   --   --   --   --   --   INR 1.08  --   --   --   --   --   --   --   HEPARINUNFRC  --   --   --   --  0.18*  --  0.62 0.67  CREATININE 1.01 1.00 1.07  --   --   --   --   --   TROPONINI  --   --  0.15* 0.16*  --  0.19*  --   --     Estimated Creatinine Clearance: 88.9 mL/min (by C-G formula based on SCr of 1.07 mg/dL).   Medications:  Scheduled:  . aspirin  81 mg Oral Daily  . diltiazem  30 mg Oral Q6H  . metoprolol tartrate  25 mg Oral QID  . polyethylene glycol  17 g Oral Daily  . sodium chloride flush  3 mL Intravenous Q12H    Assessment: 61yo male with AFib/Flutter.  Heparin level therapeutic but at upper end of goal, had expected it to dec with loss of bolus effect last PM.  No bleeding noted.  Hg and pltc wnl.  Considering oral anticoagulation pending ECHO results.  Goal of Therapy:  Heparin level 0.3-0.7 units/ml Monitor platelets by anticoagulation protocol: Yes   Plan:  Decrease heparin to 1600 units/hr Daily CBC, Heparin level F/U in AM  Gracy Bruins, [facility]

## 2017-02-22 NOTE — H&P (View-Only) (Signed)
Progress Note  Patient Name: Corey Carson Date of Encounter: 02/22/2017  Primary Cardiologist: Bronson Ing    Subjective   No CP  Still SOB with activity  Denies sensing any palpitations    Inpatient Medications    Scheduled Meds: . aspirin  81 mg Oral Daily  . diltiazem  30 mg Oral Q6H  . metoprolol tartrate  25 mg Oral QID  . polyethylene glycol  17 g Oral Daily   Continuous Infusions: . sodium chloride 100 mL/hr (02/21/17 2000)  . heparin 1,700 Units/hr (02/22/17 0411)   PRN Meds: acetaminophen, alum & mag hydroxide-simeth, bisacodyl, loperamide, ondansetron (ZOFRAN) IV   Vital Signs    Vitals:   02/22/17 0031 02/22/17 0059 02/22/17 0401 02/22/17 0805  BP: 98/75  108/79 112/72  Pulse: 61 62 64 63  Resp: (!) 23 19 (!) 26 18  Temp: 97.6 F (36.4 C)  97.9 F (36.6 C) 98.1 F (36.7 C)  TempSrc: Oral  Oral Oral  SpO2: 97% 98% 93% 94%  Weight:   259 lb 8 oz (117.7 kg)   Height:   5\' 7"  (1.702 m)     Intake/Output Summary (Last 24 hours) at 02/22/17 0904 Last data filed at 02/22/17 0411  Gross per 24 hour  Intake          1897.12 ml  Output              202 ml  Net          1695.12 ml   Filed Weights   02/20/17 2224 02/21/17 0100 02/22/17 0401  Weight: 250 lb (113.4 kg) 256 lb 3.2 oz (116.2 kg) 259 lb 8 oz (117.7 kg)    Telemetry    SR  - Personally Reviewed  ECG      Physical Exam   GEN: No acute distress.   Neck: No JVD Cardiac: RRR, no murmurs, rubs, or gallops.  Respiratory: Clear to auscultation bilaterally. GI: Soft, nontender, non-distended  MS: Tr edema; No deformity. Neuro:  Nonfocal  Psych: Normal affect   Labs    Chemistry Recent Labs Lab 02/20/17 2235 02/20/17 2243 02/21/17 0355  NA 136 141 136  K 3.6 3.6 4.4  CL 105 105 104  CO2 21*  --  20*  GLUCOSE 164* 161* 186*  BUN 23* 23* 22*  CREATININE 1.01 1.00 1.07  CALCIUM 8.7*  --  8.4*  PROT 6.4*  --   --   ALBUMIN 3.7  --   --   AST 37  --   --   ALT 54  --   --     ALKPHOS 69  --   --   BILITOT 1.0  --   --   GFRNONAA >60  --  >60  GFRAA >60  --  >60  ANIONGAP 10  --  12     Hematology Recent Labs Lab 02/20/17 2235 02/20/17 2243 02/22/17 0622  WBC 9.2  --  12.3*  RBC 5.43  --  5.69  HGB 15.4 17.0 16.1  HCT 45.9 50.0 48.9  MCV 84.5  --  85.9  MCH 28.4  --  28.3  MCHC 33.6  --  32.9  RDW 13.6  --  13.7  PLT 260  --  284    Cardiac Enzymes Recent Labs Lab 02/21/17 0355 02/21/17 0918 02/21/17 1550  TROPONINI 0.15* 0.16* 0.19*    Recent Labs Lab 02/20/17 2241  TROPIPOC 0.12*     BNP Recent Labs Lab  02/21/17 0355  BNP 812.3*     DDimer No results for input(s): DDIMER in the last 168 hours.   Radiology    Dg Chest 2 View  Result Date: 02/21/2017 CLINICAL DATA:  Right side chest pain. EXAM: CHEST  2 VIEW COMPARISON:  05/19/2013 FINDINGS: Cardiomegaly. Lungs are clear. No effusions. No acute bony abnormality. IMPRESSION: Cardiomegaly.  No active disease. Electronically Signed   By: Rolm Baptise M.D.   On: 02/21/2017 08:25   Ct Head Code Stroke W/o Cm  Result Date: 02/20/2017 CLINICAL DATA:  Code stroke. Initial evaluation for acute left arm numbness. EXAM: CT HEAD WITHOUT CONTRAST TECHNIQUE: Contiguous axial images were obtained from the base of the skull through the vertex without intravenous contrast. COMPARISON:  Prior CT from 08/27/2014. FINDINGS: Brain: Generalized cerebral atrophy with mild chronic small vessel ischemic disease. No acute intracranial hemorrhage. No evidence for acute large vessel territory infarct. No mass lesion, midline shift or mass effect. No hydrocephalus. No extra-axial fluid collection. Vascular: No hyperdense vessel. Scattered vascular calcifications noted within the carotid siphons. Skull: Scalp soft tissues demonstrate no acute abnormality. Calvarium intact. Sinuses/Orbits: Globes and orbital soft tissues within normal limits. Paranasal sinuses and mastoid air cells are clear. Other: None. ASPECTS  [facility] Stroke Program Early CT Score) - Ganglionic level infarction (caudate, lentiform nuclei, internal capsule, insula, M1-M3 cortex): 7 - Supraganglionic infarction (M4-M6 cortex): 3 Total score (0-10 with 10 being normal): 10 IMPRESSION: 1. No acute intracranial infarct or other process identified. 2. ASPECTS is 10 3. Generalized age-related cerebral atrophy with mild chronic small vessel ischemic disease, progressed from 2015. Critical Value/emergent results were called by telephone at the time of interpretation on 02/20/2017 at 10:51 pm to Dr. Nanda Quinton , who verbally acknowledged these results. Electronically Signed   By: Jeannine Boga M.D.   On: 02/20/2017 22:55    Cardiac Studies    Echo pending   Patient Profile     61 y.o. male with hx of CP, HTN, sleep apnea  Presented with new atrial flutter/atrial fib    Assessment & Plan    1  Atrial flutter/atrial fib  Pt converted to SR He has not sensed any palpitaions  Had some R sided CP last night. I have reviewed images from echo that was just done  Thre is severe LV and RV dysfunciotn  RV is larger than LV and RA is larger than LA  Consistent with pulmonary HTN Keep on heparin and tele for now  2  Systolic CHF   May be related to afib with RVR BUT R sided enlargement sugg signif pulmonary HTN (echo report mentions in 2015 some dysfunction   Based on this I would recomm R Heart cath  WOuld also perform L heart cath to r/o signif CAD given that he is on long term anticoag with arrhythmia  \ Discussed risk/benefits  Pt understands and agrees to proceed. 3  Glucose  A1C is elevated at 7.3  Will need education to start  4  HTN  D/c Ca blocker  Follow  WIll add ARB after cath.    5  Sleep apnea  Will need to be addressed prior to d/c  Esp with poss pulmonary HTN   Signed, Dorris Carnes, MD  02/22/2017, 9:04 AM

## 2017-02-22 NOTE — Progress Notes (Signed)
2100- Patient c/o nausea and stomach discomfort. VSS wnl. Gave 1 dose of IV zofran and maalox po. Pt stated some relief. Patient stated he had a small BM during the day.   Vanya.Brow- Patient called nurse and stated he felt worse, now stomach pain 8/10 with nausea. Called cardiology fellow- MD Koleen Nimrod because still c/o discomfort. MD came up to see patient.  0100- Ordered dulcolax suppository and daily Miralax. Stated patient just needs to have bowel movement.   Administered rectal suppository and patient had small BM. Will continue to monitor.

## 2017-02-22 NOTE — Progress Notes (Addendum)
Site area: RFA / RFV Site Prior to Removal:  Level 0 Pressure Applied For: 25 min Manual:  yes  Patient Status During Pull:stable   Post Pull Site:  Level 0 Post Pull Instructions Given:  yes Post Pull Pulses Present: doppler Dressing Applied:  tegaderm Bedrest begins @ 2595 till 2115 Comments:

## 2017-02-22 NOTE — Progress Notes (Signed)
Patient decided to allow PICC placement tonight. Consent obtained.

## 2017-02-22 NOTE — Progress Notes (Signed)
Peripherally Inserted Central Catheter/Midline Placement  The IV Nurse has discussed with the patient and/or persons authorized to consent for the patient, the purpose of this procedure and the potential benefits and risks involved with this procedure.  The benefits include less needle sticks, lab draws from the catheter, and the patient may be discharged home with the catheter. Risks include, but not limited to, infection, bleeding, blood clot (thrombus formation), and puncture of an artery; nerve damage and irregular heartbeat and possibility to perform a PICC exchange if needed/ordered by physician.  Alternatives to this procedure were also discussed.  Bard Power PICC patient education guide, fact sheet on infection prevention and patient information card has been provided to patient /or left at bedside.    PICC/Midline Placement Documentation  PICC Double Lumen 02/22/17 PICC Right Brachial 41 cm 0 cm (Active)  Indication for Insertion or Continuance of Line Vasoactive infusions 02/22/2017 10:01 PM  Exposed Catheter (cm) 0 cm 02/22/2017 10:01 PM  Site Assessment Clean;Dry;Intact 02/22/2017 10:01 PM  Lumen #1 Status Flushed;Saline locked;Blood return noted 02/22/2017 10:01 PM  Lumen #2 Status Flushed;Saline locked;Blood return noted 02/22/2017 10:01 PM  Dressing Type Transparent 02/22/2017 10:01 PM  Dressing Status Clean;Dry;Intact 02/22/2017 10:01 PM  Dressing Change Due 03/01/17 02/22/2017 10:01 PM       Gordan Payment 02/22/2017, 10:02 PM

## 2017-02-22 NOTE — Progress Notes (Signed)
Arrived to discuss PICC placement with patient including risks, benefits, and alternatives. Agreeable to PICC placement, but requested 3-22 AM placement. Primary RN notified.

## 2017-02-22 NOTE — Progress Notes (Signed)
Progress Note  Patient Name: COLBIE DANNER Date of Encounter: 02/22/2017  Primary Cardiologist: Bronson Ing    Subjective   No CP  Still SOB with activity  Denies sensing any palpitations    Inpatient Medications    Scheduled Meds: . aspirin  81 mg Oral Daily  . diltiazem  30 mg Oral Q6H  . metoprolol tartrate  25 mg Oral QID  . polyethylene glycol  17 g Oral Daily   Continuous Infusions: . sodium chloride 100 mL/hr (02/21/17 2000)  . heparin 1,700 Units/hr (02/22/17 0411)   PRN Meds: acetaminophen, alum & mag hydroxide-simeth, bisacodyl, loperamide, ondansetron (ZOFRAN) IV   Vital Signs    Vitals:   02/22/17 0031 02/22/17 0059 02/22/17 0401 02/22/17 0805  BP: 98/75  108/79 112/72  Pulse: 61 62 64 63  Resp: (!) 23 19 (!) 26 18  Temp: 97.6 F (36.4 C)  97.9 F (36.6 C) 98.1 F (36.7 C)  TempSrc: Oral  Oral Oral  SpO2: 97% 98% 93% 94%  Weight:   259 lb 8 oz (117.7 kg)   Height:   5\' 7"  (1.702 m)     Intake/Output Summary (Last 24 hours) at 02/22/17 0904 Last data filed at 02/22/17 0411  Gross per 24 hour  Intake          1897.12 ml  Output              202 ml  Net          1695.12 ml   Filed Weights   02/20/17 2224 02/21/17 0100 02/22/17 0401  Weight: 250 lb (113.4 kg) 256 lb 3.2 oz (116.2 kg) 259 lb 8 oz (117.7 kg)    Telemetry    SR  - Personally Reviewed  ECG      Physical Exam   GEN: No acute distress.   Neck: No JVD Cardiac: RRR, no murmurs, rubs, or gallops.  Respiratory: Clear to auscultation bilaterally. GI: Soft, nontender, non-distended  MS: Tr edema; No deformity. Neuro:  Nonfocal  Psych: Normal affect   Labs    Chemistry Recent Labs Lab 02/20/17 2235 02/20/17 2243 02/21/17 0355  NA 136 141 136  K 3.6 3.6 4.4  CL 105 105 104  CO2 21*  --  20*  GLUCOSE 164* 161* 186*  BUN 23* 23* 22*  CREATININE 1.01 1.00 1.07  CALCIUM 8.7*  --  8.4*  PROT 6.4*  --   --   ALBUMIN 3.7  --   --   AST 37  --   --   ALT 54  --   --     ALKPHOS 69  --   --   BILITOT 1.0  --   --   GFRNONAA >60  --  >60  GFRAA >60  --  >60  ANIONGAP 10  --  12     Hematology Recent Labs Lab 02/20/17 2235 02/20/17 2243 02/22/17 0622  WBC 9.2  --  12.3*  RBC 5.43  --  5.69  HGB 15.4 17.0 16.1  HCT 45.9 50.0 48.9  MCV 84.5  --  85.9  MCH 28.4  --  28.3  MCHC 33.6  --  32.9  RDW 13.6  --  13.7  PLT 260  --  284    Cardiac Enzymes Recent Labs Lab 02/21/17 0355 02/21/17 0918 02/21/17 1550  TROPONINI 0.15* 0.16* 0.19*    Recent Labs Lab 02/20/17 2241  TROPIPOC 0.12*     BNP Recent Labs Lab  02/21/17 0355  BNP 812.3*     DDimer No results for input(s): DDIMER in the last 168 hours.   Radiology    Dg Chest 2 View  Result Date: 02/21/2017 CLINICAL DATA:  Right side chest pain. EXAM: CHEST  2 VIEW COMPARISON:  05/19/2013 FINDINGS: Cardiomegaly. Lungs are clear. No effusions. No acute bony abnormality. IMPRESSION: Cardiomegaly.  No active disease. Electronically Signed   By: Rolm Baptise M.D.   On: 02/21/2017 08:25   Ct Head Code Stroke W/o Cm  Result Date: 02/20/2017 CLINICAL DATA:  Code stroke. Initial evaluation for acute left arm numbness. EXAM: CT HEAD WITHOUT CONTRAST TECHNIQUE: Contiguous axial images were obtained from the base of the skull through the vertex without intravenous contrast. COMPARISON:  Prior CT from 08/27/2014. FINDINGS: Brain: Generalized cerebral atrophy with mild chronic small vessel ischemic disease. No acute intracranial hemorrhage. No evidence for acute large vessel territory infarct. No mass lesion, midline shift or mass effect. No hydrocephalus. No extra-axial fluid collection. Vascular: No hyperdense vessel. Scattered vascular calcifications noted within the carotid siphons. Skull: Scalp soft tissues demonstrate no acute abnormality. Calvarium intact. Sinuses/Orbits: Globes and orbital soft tissues within normal limits. Paranasal sinuses and mastoid air cells are clear. Other: None. ASPECTS  [facility] Stroke Program Early CT Score) - Ganglionic level infarction (caudate, lentiform nuclei, internal capsule, insula, M1-M3 cortex): 7 - Supraganglionic infarction (M4-M6 cortex): 3 Total score (0-10 with 10 being normal): 10 IMPRESSION: 1. No acute intracranial infarct or other process identified. 2. ASPECTS is 10 3. Generalized age-related cerebral atrophy with mild chronic small vessel ischemic disease, progressed from 2015. Critical Value/emergent results were called by telephone at the time of interpretation on 02/20/2017 at 10:51 pm to Dr. Nanda Quinton , who verbally acknowledged these results. Electronically Signed   By: Jeannine Boga M.D.   On: 02/20/2017 22:55    Cardiac Studies    Echo pending   Patient Profile     61 y.o. male with hx of CP, HTN, sleep apnea  Presented with new atrial flutter/atrial fib    Assessment & Plan    1  Atrial flutter/atrial fib  Pt converted to SR He has not sensed any palpitaions  Had some R sided CP last night. I have reviewed images from echo that was just done  Thre is severe LV and RV dysfunciotn  RV is larger than LV and RA is larger than LA  Consistent with pulmonary HTN Keep on heparin and tele for now  2  Systolic CHF   May be related to afib with RVR BUT R sided enlargement sugg signif pulmonary HTN (echo report mentions in 2015 some dysfunction   Based on this I would recomm R Heart cath  WOuld also perform L heart cath to r/o signif CAD given that he is on long term anticoag with arrhythmia  \ Discussed risk/benefits  Pt understands and agrees to proceed. 3  Glucose  A1C is elevated at 7.3  Will need education to start  4  HTN  D/c Ca blocker  Follow  WIll add ARB after cath.    5  Sleep apnea  Will need to be addressed prior to d/c  Esp with poss pulmonary HTN   Signed, Dorris Carnes, MD  02/22/2017, 9:04 AM

## 2017-02-22 NOTE — Progress Notes (Signed)
Patient ID: AGUSTIN SWATEK, male   DOB: Aug 18, 1956, 61 y.o.   MRN: [id number]  Patient discussed with Dr. Harrington Challenger and cath results reviewed.  R>L heart failure.  Echo with severe biventricular failure, nonischemic cardiomyopathy.  Cardiac index markedly low though patient clinically stable with good BP.  Admitted with atrial flutter/RVR of unclear duration, spontaneously converted to NSR.    For now, would place PICC and start milrinone 0.25 + amiodarone gtt to maintain NSR.  He will need to be anticoagulated.  Lasix 80 mg IV every 8 hrs for now.   We will see in am.   Loralie Champagne 02/22/2017 5:15 PM

## 2017-02-22 NOTE — Progress Notes (Signed)
  Echocardiogram 2D Echocardiogram has been performed with definity.  Aggie Cosier 02/22/2017, 10:34 AM

## 2017-02-22 NOTE — Interval H&P Note (Signed)
Cath Lab Visit (complete for each Cath Lab visit)  Clinical Evaluation Leading to the Procedure:   ACS: Yes.    Non-ACS:  n/a    History and Physical Interval Note:  02/22/2017 2:57 PM  CHARISTOPHER RUMBLE  has presented today for surgery, with the diagnosis of afib/hf  The various methods of treatment have been discussed with the patient and family. After consideration of risks, benefits and other options for treatment, the patient has consented to  Procedure(s): Right/Left Heart Cath and Coronary Angiography (N/A) as a surgical intervention .  The patient's history has been reviewed, patient examined, no change in status, stable for surgery.  I have reviewed the patient's chart and labs.  Questions were answered to the patient's satisfaction.     Kathlyn Sacramento

## 2017-02-22 NOTE — Progress Notes (Addendum)
ANTICOAGULATION CONSULT NOTE - Follow Up Consult  Pharmacy Consult for Heparin > apixaban Indication: atrial fibrillation  Allergies  Allergen Reactions  . Sulfa Antibiotics Anaphylaxis    Patient Measurements: Height: 5\' 7"  (170.2 cm) Weight: 259 lb 8 oz (117.7 kg) IBW/kg (Calculated) : 66.1  Vital Signs: Temp: 98.4 F (36.9 C) (03/21 1140) Temp Source: Oral (03/21 1140) BP: 121/88 (03/21 1710) Pulse Rate: 70 (03/21 1710)  Labs:  Recent Labs  02/20/17 2235 02/20/17 2243 02/21/17 0355 02/21/17 0918 02/21/17 1254 02/21/17 1550 02/21/17 2045 02/22/17 0622  HGB 15.4 17.0  --   --   --   --   --  16.1  HCT 45.9 50.0  --   --   --   --   --  48.9  PLT 260  --   --   --   --   --   --  284  APTT 27  --   --   --   --   --   --   --   LABPROT 14.0  --   --   --   --   --   --   --   INR 1.08  --   --   --   --   --   --   --   HEPARINUNFRC  --   --   --   --  0.18*  --  0.62 0.67  CREATININE 1.01 1.00 1.07  --   --   --   --   --   TROPONINI  --   --  0.15* 0.16*  --  0.19*  --   --     Estimated Creatinine Clearance: 88.9 mL/min (by C-G formula based on SCr of 1.07 mg/dL).   Medications:  Scheduled:  . furosemide  80 mg Intravenous BID  . metoprolol tartrate  25 mg Oral QID  . polyethylene glycol  17 g Oral Daily    Assessment: 61yo male with AFib/Flutter.  Heparin level therapeutic but at upper end of goal, had expected it to dec with loss of bolus effect last PM.  No bleeding noted.  Hg and pltc wnl.  Considering oral anticoagulation pending ECHO results.  Now s/p cath lab, asked to start apixaban 8 hrs after sheath pull by Dr. Aundra Dubin.  Sheath removed at 1715 pm.    Goal of Therapy:  Monitor platelets by anticoagulation protocol: Yes   Plan:  Start apixaban at 0100 on 3/22 (8 hrs after sheath out) Then will schedule at 10a/10p. Will need education prior to discharge.  Uvaldo Rising, BCPS  Clinical Pharmacist Pager [phone]   02/22/2017 5:35 PM

## 2017-02-23 ENCOUNTER — Encounter (HOSPITAL_COMMUNITY): Payer: Self-pay | Admitting: Cardiovascular Disease

## 2017-02-23 DIAGNOSIS — I484 Atypical atrial flutter: Secondary | ICD-10-CM

## 2017-02-23 LAB — BASIC METABOLIC PANEL
ANION GAP: 12 (ref 5–15)
BUN: 24 mg/dL — ABNORMAL HIGH (ref 6–20)
CO2: 23 mmol/L (ref 22–32)
Calcium: 8 mg/dL — ABNORMAL LOW (ref 8.9–10.3)
Chloride: 97 mmol/L — ABNORMAL LOW (ref 101–111)
Creatinine, Ser: 1.09 mg/dL (ref 0.61–1.24)
GFR calc Af Amer: >60 mL/min (ref >60)
GLUCOSE: 299 mg/dL — AB (ref 65–99)
POTASSIUM: 3.6 mmol/L (ref 3.5–5.1)
Sodium: 132 mmol/L — ABNORMAL LOW (ref 135–145)

## 2017-02-23 LAB — COOXEMETRY PANEL
Carboxyhemoglobin: 1.5 % (ref 0.5–1.5)
METHEMOGLOBIN: 0.8 % (ref 0.0–1.5)
O2 Saturation: 59.5 %
Total hemoglobin: 14.2 g/dL (ref 12.0–16.0)

## 2017-02-23 LAB — CBC
HCT: 43.2 % (ref 39.0–52.0)
Hemoglobin: 14.3 g/dL (ref 13.0–17.0)
MCH: 28 pg (ref 26.0–34.0)
MCHC: 33.1 g/dL (ref 30.0–36.0)
MCV: 84.7 fL (ref 78.0–100.0)
PLATELETS: 249 10*3/uL (ref 150–400)
RBC: 5.1 MIL/uL (ref 4.22–5.81)
RDW: 13.5 % (ref 11.5–15.5)
WBC: 10.8 10*3/uL — AB (ref 4.0–10.5)

## 2017-02-23 MED ORDER — HYDRALAZINE HCL 10 MG PO TABS
10.0000 mg | ORAL_TABLET | Freq: Three times a day (TID) | ORAL | Status: DC
  Administered 2017-02-23 – 2017-02-24 (×3): 10 mg via ORAL
  Filled 2017-02-23 (×3): qty 1

## 2017-02-23 MED ORDER — ATORVASTATIN CALCIUM 20 MG PO TABS
20.0000 mg | ORAL_TABLET | Freq: Every day | ORAL | Status: DC
  Administered 2017-02-23 – 2017-03-02 (×8): 20 mg via ORAL
  Filled 2017-02-23 (×9): qty 1

## 2017-02-23 MED ORDER — SPIRONOLACTONE 25 MG PO TABS
12.5000 mg | ORAL_TABLET | Freq: Every day | ORAL | Status: DC
  Administered 2017-02-23: 12.5 mg via ORAL
  Filled 2017-02-23: qty 1

## 2017-02-23 MED ORDER — ISOSORBIDE MONONITRATE ER 30 MG PO TB24
30.0000 mg | ORAL_TABLET | Freq: Every day | ORAL | Status: DC
  Administered 2017-02-23 – 2017-03-03 (×9): 30 mg via ORAL
  Filled 2017-02-23 (×9): qty 1

## 2017-02-23 MED ORDER — DIGOXIN 125 MCG PO TABS
0.1250 mg | ORAL_TABLET | Freq: Every day | ORAL | Status: DC
  Administered 2017-02-23 – 2017-03-03 (×9): 0.125 mg via ORAL
  Filled 2017-02-23 (×9): qty 1

## 2017-02-23 MED ORDER — HYDRALAZINE HCL 10 MG PO TABS
10.0000 mg | ORAL_TABLET | Freq: Three times a day (TID) | ORAL | Status: DC
  Administered 2017-02-23: 10 mg via ORAL
  Filled 2017-02-23: qty 1

## 2017-02-23 NOTE — Progress Notes (Signed)
   Afternoon follow up.   CVP trending down 13-14. Increased urine output noted.   Check BMEt in am.    Continue current regimen.   Amy Clegg NP-C  3:40 PM

## 2017-02-23 NOTE — Progress Notes (Signed)
Patient ID: Corey Carson, male   DOB: Jul 24, 1956, 61 y.o.   MRN: [id number]   SUBJECTIVE: Breathing better on milrinone, says he had good UOP last night after getting IV Lasix.  I/Os still not particularly negative but only had 1 dose last night.   This morning, co-ox 59.5% on milrinone 0.25, CVP 16-20.   Echo: EF 20%, D-shaped septum with moderate RV dilation/severely decreased RV systolic function.   RHC/LHC:  - Coronary angio with 60% OM1, 40% pLAD RA mean 21 PA 30/11 PCWP mean 19 CI 1.3  Scheduled Meds: . apixaban  5 mg Oral BID  . digoxin  0.125 mg Oral Daily  . furosemide  80 mg Intravenous TID  . polyethylene glycol  17 g Oral Daily  . sodium chloride flush  10-40 mL Intracatheter Q12H   Continuous Infusions: . sodium chloride 100 mL/hr (02/21/17 2000)  . amiodarone 30 mg/hr (02/23/17 0438)  . milrinone 0.25 mcg/kg/min (02/23/17 0438)   PRN Meds:.acetaminophen, alum & mag hydroxide-simeth, bisacodyl, loperamide, ondansetron (ZOFRAN) IV, sodium chloride flush    Vitals:   02/22/17 2000 02/22/17 2016 02/22/17 2030 02/23/17 0546  BP:  123/81  122/73  Pulse: 72 71 73 71  Resp: (!) 23 (!) 26 20 20   Temp:  98.2 F (36.8 C)  98.4 F (36.9 C)  TempSrc:  Oral  Oral  SpO2: 96% 96% 96% 94%  Weight:    259 lb 1.6 oz (117.5 kg)  Height:    5\' 7"  (1.702 m)    Intake/Output Summary (Last 24 hours) at 02/23/17 0741 Last data filed at 02/23/17 0545  Gross per 24 hour  Intake          1056.95 ml  Output             1660 ml  Net          -603.05 ml    LABS: Basic Metabolic Panel:  Recent Labs  02/20/17 2235 02/20/17 2243 02/21/17 0355  NA 136 141 136  K 3.6 3.6 4.4  CL 105 105 104  CO2 21*  --  20*  GLUCOSE 164* 161* 186*  BUN 23* 23* 22*  CREATININE 1.01 1.00 1.07  CALCIUM 8.7*  --  8.4*  MG 1.7  --   --    Liver Function Tests:  Recent Labs  02/20/17 2235  AST 37  ALT 54  ALKPHOS 69  BILITOT 1.0  PROT 6.4*  ALBUMIN 3.7   No results for  input(s): LIPASE, AMYLASE in the last 72 hours. CBC:  Recent Labs  02/20/17 2235  02/22/17 0622 02/23/17 0527  WBC 9.2  --  12.3* 10.8*  NEUTROABS 5.1  --   --   --   HGB 15.4  < > 16.1 14.3  HCT 45.9  < > 48.9 43.2  MCV 84.5  --  85.9 84.7  PLT 260  --  284 249  < > = values in this interval not displayed. Cardiac Enzymes:  Recent Labs  02/21/17 0355 02/21/17 0918 02/21/17 1550  TROPONINI 0.15* 0.16* 0.19*   BNP: Invalid input(s): POCBNP D-Dimer: No results for input(s): DDIMER in the last 72 hours. Hemoglobin A1C:  Recent Labs  02/21/17 0355  HGBA1C 7.3*   Fasting Lipid Panel:  Recent Labs  02/21/17 0355  CHOL 175  HDL 35*  LDLCALC 118*  TRIG 108  CHOLHDL 5.0   Thyroid Function Tests:  Recent Labs  02/21/17 0355  TSH 2.094   Anemia Panel: No  results for input(s): VITAMINB12, FOLATE, FERRITIN, TIBC, IRON, RETICCTPCT in the last 72 hours.  RADIOLOGY: Dg Chest 2 View  Result Date: 02/21/2017 CLINICAL DATA:  Right side chest pain. EXAM: CHEST  2 VIEW COMPARISON:  05/19/2013 FINDINGS: Cardiomegaly. Lungs are clear. No effusions. No acute bony abnormality. IMPRESSION: Cardiomegaly.  No active disease. Electronically Signed   By: Rolm Baptise M.D.   On: 02/21/2017 08:25   Ct Head Code Stroke W/o Cm  Result Date: 02/20/2017 CLINICAL DATA:  Code stroke. Initial evaluation for acute left arm numbness. EXAM: CT HEAD WITHOUT CONTRAST TECHNIQUE: Contiguous axial images were obtained from the base of the skull through the vertex without intravenous contrast. COMPARISON:  Prior CT from 08/27/2014. FINDINGS: Brain: Generalized cerebral atrophy with mild chronic small vessel ischemic disease. No acute intracranial hemorrhage. No evidence for acute large vessel territory infarct. No mass lesion, midline shift or mass effect. No hydrocephalus. No extra-axial fluid collection. Vascular: No hyperdense vessel. Scattered vascular calcifications noted within the carotid  siphons. Skull: Scalp soft tissues demonstrate no acute abnormality. Calvarium intact. Sinuses/Orbits: Globes and orbital soft tissues within normal limits. Paranasal sinuses and mastoid air cells are clear. Other: None. ASPECTS [facility] Stroke Program Early CT Score) - Ganglionic level infarction (caudate, lentiform nuclei, internal capsule, insula, M1-M3 cortex): 7 - Supraganglionic infarction (M4-M6 cortex): 3 Total score (0-10 with 10 being normal): 10 IMPRESSION: 1. No acute intracranial infarct or other process identified. 2. ASPECTS is 10 3. Generalized age-related cerebral atrophy with mild chronic small vessel ischemic disease, progressed from 2015. Critical Value/emergent results were called by telephone at the time of interpretation on 02/20/2017 at 10:51 pm to Dr. Nanda Quinton , who verbally acknowledged these results. Electronically Signed   By: Jeannine Boga M.D.   On: 02/20/2017 22:55    PHYSICAL EXAM General: NAD Neck: JVP 14-16, no thyromegaly or thyroid nodule.  Lungs: Clear to auscultation bilaterally with normal respiratory effort. CV: Nondisplaced PMI.  Soft heart sounds, heart regular S1/S2, no S3/S4, no murmur.  1+ edema to knees bilaterally.   Abdomen: Soft, nontender, no hepatosplenomegaly, mild distention.  Neurologic: Alert and oriented x 3.  Psych: Normal affect. Extremities: No clubbing or cyanosis.   TELEMETRY: Personally reviewed telemetry pt in NSR with occasional PACs  ASSESSMENT AND PLAN: 61 yo admitted with dyspnea and lower extremity edema, found to be in atrial flutter.  He converted back to NSR spontaneously. Found to have biventricular failure by echo, LHC/RHC with nonobstructive CAD, primarily RV failure, and low output.  1. Atrial flutter: Of uncertain duration.  He converted back to NSR spontaneously in the hospital.  NSR today.  - Continue apixaban.  - Would consider eventual EP evaluation for flutter ablation, though appearance is somewhat  atypical. - Will use amiodarone to maintain NSR while on milrinone, then stop. Hold off on metoprolol and diltiazem for now.  2. Acute systolic CHF: Biventricular failure, echo with EF 20%, D-shaped septum, moderately dilated/severely dysfunctional RV.  TSH, HIV normal.  Does not drink ETOH.  Mother with CHF.  Nonobstructive CAD on cath.  RHC with restrictive hemodynamics and prominent RV failure.  Low cardiac output.  Output improved on milrinone 0.25 with co-ox 59.5% this morning, CVP remains 16-20.   - Continue milrinone 0.25. - Lasix 80 mg IV every 8 hrs.  - Add digoxin with low output.  - Start hydralazine 10 mg every 8 hrs, Imdur 30 daily.  - Needs BMET this morning.  3. CAD: Nonobstructive.  Will give  statin.   Loralie Champagne 02/23/2017 7:51 AM

## 2017-02-23 NOTE — Care Management Note (Addendum)
Case Management Note  Patient Details  Name: Corey Carson MRN: [id number] Date of Birth: 1956-06-25  Subjective/Objective:  Pt presented for dyspnea and lower extremity edema. Pt initiated on IV Milrinone and IV Lasix.  Pt has [employer], however pt does not have card.-Family to bring. CM will do the benefits check for Eliquis once card has been presented.                 Action/Plan: CM will continue to monitor for disposition needs.   Expected Discharge Date:                  Expected Discharge Plan:  Home/Self Care  In-House Referral:  NA  Discharge planning Services  CM Consult, Medication Assistance  Post Acute Care Choice:   N/A Choice offered to:   N/A  DME Arranged:   N/A DME Agency:   N/A  HH Arranged:   N/A HH Agency:   N/A  Status of Service:COMPLETED  If discussed at Long Length of Stay Meetings, dates discussed:    Additional Comments: 1032 03-03-17 Jacqlyn Krauss, RN,BSN [phone] No home needs identified for patient. CM did contact the [facility] and Eliquis and Delene Loll is available. Pt aware to get medications at that Pharmacy. Pt usually uses Personal assistant for additional medications. No further needs from CM at this time.    1016 02-28-17 Moselle Rister Graves-Bigelow, RN,BSN [phone] CM did do benefits check for Entresto and cost will ne 45.99. CM will make pt aware and will provide 30 day free and co pay card. CM will continue to monitor for home needs.    [place] 02-24-17 Jacqlyn Krauss, RN, BSN [phone] Insurance verified in regards to [place]. Co pay will be: Eliquis 5mg  [facility] 30 day supply $83.10 covered  90 day $249.04 covered. Pt has 30 day free and discount card.       1211 02-24-17 Jacqlyn Krauss, RN,BSN [phone] CMA did check [employer] and Per [place] on 25/05/39. CM did call the [facility] to make them aware. Per [employer] insurance is active via [place] in [place]. If no insurance active CM  will need to provide pt with patient assistance form in order for pt to get assistance with medication.    1004 02-24-17 Jacqlyn Krauss, RN,BSN [phone] CM will provide pt with 30 day free card for Eliquis. Girlfriend to fax insurance card to check to see if valid and then will do benefits check. CM did reach out to [facility] in regards to pt has questions. CM will continue to monitor.  Bethena Roys, RN 02/23/2017, 12:44 PM

## 2017-02-24 LAB — BASIC METABOLIC PANEL
Anion gap: 11 (ref 5–15)
BUN: 21 mg/dL — ABNORMAL HIGH (ref 6–20)
CALCIUM: 8.3 mg/dL — AB (ref 8.9–10.3)
CHLORIDE: 94 mmol/L — AB (ref 101–111)
CO2: 28 mmol/L (ref 22–32)
CREATININE: 1.17 mg/dL (ref 0.61–1.24)
Glucose, Bld: 200 mg/dL — ABNORMAL HIGH (ref 65–99)
Potassium: 3.1 mmol/L — ABNORMAL LOW (ref 3.5–5.1)
SODIUM: 133 mmol/L — AB (ref 135–145)

## 2017-02-24 LAB — CBC
HCT: 40.8 % (ref 39.0–52.0)
Hemoglobin: 13.5 g/dL (ref 13.0–17.0)
MCH: 28 pg (ref 26.0–34.0)
MCHC: 33.1 g/dL (ref 30.0–36.0)
MCV: 84.6 fL (ref 78.0–100.0)
PLATELETS: 243 10*3/uL (ref 150–400)
RBC: 4.82 MIL/uL (ref 4.22–5.81)
RDW: 13.5 % (ref 11.5–15.5)
WBC: 9.9 10*3/uL (ref 4.0–10.5)

## 2017-02-24 LAB — COOXEMETRY PANEL
Carboxyhemoglobin: 1.2 % (ref 0.5–1.5)
Methemoglobin: 1 % (ref 0.0–1.5)
O2 SAT: 72.3 %
TOTAL HEMOGLOBIN: 13.9 g/dL (ref 12.0–16.0)

## 2017-02-24 LAB — GLUCOSE, CAPILLARY
Glucose-Capillary: 156 mg/dL — ABNORMAL HIGH (ref 65–99)
Glucose-Capillary: 142 mg/dL — ABNORMAL HIGH (ref 65–99)
Glucose-Capillary: 192 mg/dL — ABNORMAL HIGH (ref 65–99)

## 2017-02-24 MED ORDER — POTASSIUM CHLORIDE CRYS ER 20 MEQ PO TBCR
40.0000 meq | EXTENDED_RELEASE_TABLET | Freq: Once | ORAL | Status: AC
  Administered 2017-02-24: 40 meq via ORAL
  Filled 2017-02-24: qty 2

## 2017-02-24 MED ORDER — SPIRONOLACTONE 25 MG PO TABS
25.0000 mg | ORAL_TABLET | Freq: Every day | ORAL | Status: DC
  Administered 2017-02-24 – 2017-02-26 (×3): 25 mg via ORAL
  Filled 2017-02-24 (×3): qty 1

## 2017-02-24 MED ORDER — POTASSIUM CHLORIDE CRYS ER 20 MEQ PO TBCR
40.0000 meq | EXTENDED_RELEASE_TABLET | Freq: Once | ORAL | Status: AC
  Administered 2017-02-24: 40 meq via ORAL

## 2017-02-24 MED ORDER — INSULIN ASPART 100 UNIT/ML ~~LOC~~ SOLN
0.0000 [IU] | Freq: Three times a day (TID) | SUBCUTANEOUS | Status: DC
  Administered 2017-02-24: 2 [IU] via SUBCUTANEOUS
  Administered 2017-02-24 – 2017-02-25 (×3): 3 [IU] via SUBCUTANEOUS
  Administered 2017-02-25 – 2017-02-26 (×2): 2 [IU] via SUBCUTANEOUS
  Administered 2017-02-26: 3 [IU] via SUBCUTANEOUS
  Administered 2017-02-26: 5 [IU] via SUBCUTANEOUS
  Administered 2017-02-27: 2 [IU] via SUBCUTANEOUS
  Administered 2017-02-27: 3 [IU] via SUBCUTANEOUS
  Administered 2017-02-27: 2 [IU] via SUBCUTANEOUS
  Administered 2017-02-28 (×2): 3 [IU] via SUBCUTANEOUS
  Administered 2017-02-28: 2 [IU] via SUBCUTANEOUS
  Administered 2017-03-01 (×2): 3 [IU] via SUBCUTANEOUS
  Administered 2017-03-01: 5 [IU] via SUBCUTANEOUS
  Administered 2017-03-02 (×2): 3 [IU] via SUBCUTANEOUS
  Administered 2017-03-02: 2 [IU] via SUBCUTANEOUS

## 2017-02-24 MED ORDER — HYDRALAZINE HCL 25 MG PO TABS
25.0000 mg | ORAL_TABLET | Freq: Three times a day (TID) | ORAL | Status: DC
  Administered 2017-02-24 – 2017-03-02 (×19): 25 mg via ORAL
  Filled 2017-02-24 (×18): qty 1

## 2017-02-24 MED ORDER — POTASSIUM CHLORIDE CRYS ER 20 MEQ PO TBCR: 40.0000 meq | EXTENDED_RELEASE_TABLET | Freq: Once | ORAL | Status: DC

## 2017-02-24 MED ORDER — POTASSIUM CHLORIDE CRYS ER 20 MEQ PO TBCR
40.0000 meq | EXTENDED_RELEASE_TABLET | Freq: Every day | ORAL | Status: DC
  Administered 2017-02-25 – 2017-02-26 (×2): 40 meq via ORAL
  Filled 2017-02-24 (×3): qty 2

## 2017-02-24 MED ORDER — LIVING WELL WITH DIABETES BOOK
Freq: Once | Status: AC
  Administered 2017-02-24: 12:00:00
  Filled 2017-02-24 (×2): qty 1

## 2017-02-24 NOTE — Progress Notes (Signed)
Nutrition Education Consult  RD consulted for nutrition education regarding new onset diabetes.   Lab Results  Component Value Date   HGBA1C 7.3 (H) 02/21/2017   Patient reports that he has never been told in the past that he has diabetes. He seemed overwhelmed and uncertain about this new diagnosis, laughing during RD's attempt to provide diet education. He does not seem ready to learn about his diet at this time.   RD provided "Carbohydrate Counting for People with Diabetes" handout from the Academy of Nutrition and Dietetics. Left handout at patient's bedside, per his request.  Expect poor compliance. Needs outpatient diet education.  Body mass index is 38.73 kg/m. Pt meets criteria for class 2 obesity based on current BMI.  Current diet order is heart healthy, patient is consuming approximately 50-100% of meals at this time. Labs and medications reviewed. No further nutrition interventions warranted at this time. RD contact information provided. If additional nutrition issues arise, please re-consult RD.  Molli Barrows, RD, LDN, [place] Pager [phone] After Hours Pager [phone]

## 2017-02-24 NOTE — Progress Notes (Addendum)
Inpatient Diabetes Program Recommendations  AACE/ADA: New Consensus Statement on Inpatient Glycemic Control (2015)  Target Ranges:  Prepandial:   less than 140 mg/dL      Peak postprandial:   less than 180 mg/dL (1-2 hours)      Critically ill patients:  140 - 180 mg/dL   Lab Results  Component Value Date   GLUCAP 156 (H) 02/24/2017   HGBA1C 7.3 (H) 02/21/2017    Review of Glycemic Control:  Results for GAVINN, COLLARD (MRN [id number]) as of 02/24/2017 15:49  Ref. Range 02/24/2017 11:42  Glucose-Capillary Latest Ref Range: 65 - 99 mg/dL 156 (H)   Diabetes history: New onset type 2 diabetes Outpatient Diabetes medications: None Current orders for Inpatient glycemic control:  Novolog moderate tid with meals  Inpatient Diabetes Program Recommendations:    Spoke with patient regarding new onset diabetes.  He states that he can tell when his blood sugars are up and that he thinks his increased consumption of sweets has contributed.  Briefly discussed A1C results, normal blood sugars, foot care, elimination of sweets from beverages and importance of follow-up with PCP.   He verbalized understanding however needs reinforcement of information.  Ordered diabetes videos and Living Well with diabetes booklet as well.   MD, please consider adding "Diabetes" to problem list and order "outpatient diabetes education".   Thanks, Adah Perl, RN, BC-ADM Inpatient Diabetes Coordinator Pager [phone] (8a-5p)

## 2017-02-24 NOTE — Progress Notes (Signed)
I was notified by Monitor tech that patient is having more nonsustained beats of wide QRS complexes and PVCs. Pt asymptomatic. He has at most had 7 beats for me today. I notified Nell Range and she stated to notify her if it increases anymore or pt symptomatic. Will closely monitor

## 2017-02-24 NOTE — Progress Notes (Signed)
Patient ID: Corey Carson, male   DOB: 16-Jul-1956, 61 y.o.   MRN: [id number]   SUBJECTIVE: Breathing better, I/Os negative with decreased weight.   This morning, co-ox 72% on milrinone 0.25, CVP 15.   Echo: EF 20%, D-shaped septum with moderate RV dilation/severely decreased RV systolic function.   RHC/LHC:  - Coronary angio with 60% OM1, 40% pLAD RA mean 21 PA 30/11 PCWP mean 19 CI 1.3  Scheduled Meds: . apixaban  5 mg Oral BID  . atorvastatin  20 mg Oral q1800  . digoxin  0.125 mg Oral Daily  . furosemide  80 mg Intravenous TID  . hydrALAZINE  25 mg Oral Q8H  . isosorbide mononitrate  30 mg Oral Daily  . polyethylene glycol  17 g Oral Daily  . potassium chloride  40 mEq Oral Once  . [START ON 02/25/2017] potassium chloride  40 mEq Oral Daily  . potassium chloride  40 mEq Oral Once  . sodium chloride flush  10-40 mL Intracatheter Q12H  . spironolactone  25 mg Oral Daily   Continuous Infusions: . sodium chloride 100 mL/hr (02/21/17 2000)  . amiodarone 30 mg/hr (02/24/17 0158)  . milrinone 0.25 mcg/kg/min (02/24/17 0344)   PRN Meds:.acetaminophen, alum & mag hydroxide-simeth, bisacodyl, loperamide, ondansetron (ZOFRAN) IV, sodium chloride flush    Vitals:   02/23/17 1955 02/24/17 0025 02/24/17 0341 02/24/17 0342  BP: (!) 136/92 110/62  121/80  Pulse:  74  75  Resp:      Temp:  97.6 F (36.4 C)  98 F (36.7 C)  TempSrc:  Axillary  Oral  SpO2:  97%  96%  Weight:   247 lb 4.8 oz (112.2 kg)   Height:        Intake/Output Summary (Last 24 hours) at 02/24/17 0740 Last data filed at 02/24/17 0600  Gross per 24 hour  Intake             1760 ml  Output             4700 ml  Net            -2940 ml    LABS: Basic Metabolic Panel:  Recent Labs  02/23/17 0750 02/24/17 0429  NA 132* 133*  K 3.6 3.1*  CL 97* 94*  CO2 23 28  GLUCOSE 299* 200*  BUN 24* 21*  CREATININE 1.09 1.17  CALCIUM 8.0* 8.3*   Liver Function Tests: No results for input(s): AST, ALT,  ALKPHOS, BILITOT, PROT, ALBUMIN in the last 72 hours. No results for input(s): LIPASE, AMYLASE in the last 72 hours. CBC:  Recent Labs  02/23/17 0527 02/24/17 0429  WBC 10.8* 9.9  HGB 14.3 13.5  HCT 43.2 40.8  MCV 84.7 84.6  PLT 249 243   Cardiac Enzymes:  Recent Labs  02/21/17 0918 02/21/17 1550  TROPONINI 0.16* 0.19*   BNP: Invalid input(s): POCBNP D-Dimer: No results for input(s): DDIMER in the last 72 hours. Hemoglobin A1C: No results for input(s): HGBA1C in the last 72 hours. Fasting Lipid Panel: No results for input(s): CHOL, HDL, LDLCALC, TRIG, CHOLHDL, LDLDIRECT in the last 72 hours. Thyroid Function Tests: No results for input(s): TSH, T4TOTAL, T3FREE, THYROIDAB in the last 72 hours.  Invalid input(s): FREET3 Anemia Panel: No results for input(s): VITAMINB12, FOLATE, FERRITIN, TIBC, IRON, RETICCTPCT in the last 72 hours.  RADIOLOGY: Dg Chest 2 View  Result Date: 02/21/2017 CLINICAL DATA:  Right side chest pain. EXAM: CHEST  2 VIEW COMPARISON:  05/19/2013 FINDINGS:  Cardiomegaly. Lungs are clear. No effusions. No acute bony abnormality. IMPRESSION: Cardiomegaly.  No active disease. Electronically Signed   By: Rolm Baptise M.D.   On: 02/21/2017 08:25   Ct Head Code Stroke W/o Cm  Result Date: 02/20/2017 CLINICAL DATA:  Code stroke. Initial evaluation for acute left arm numbness. EXAM: CT HEAD WITHOUT CONTRAST TECHNIQUE: Contiguous axial images were obtained from the base of the skull through the vertex without intravenous contrast. COMPARISON:  Prior CT from 08/27/2014. FINDINGS: Brain: Generalized cerebral atrophy with mild chronic small vessel ischemic disease. No acute intracranial hemorrhage. No evidence for acute large vessel territory infarct. No mass lesion, midline shift or mass effect. No hydrocephalus. No extra-axial fluid collection. Vascular: No hyperdense vessel. Scattered vascular calcifications noted within the carotid siphons. Skull: Scalp soft  tissues demonstrate no acute abnormality. Calvarium intact. Sinuses/Orbits: Globes and orbital soft tissues within normal limits. Paranasal sinuses and mastoid air cells are clear. Other: None. ASPECTS [facility] Stroke Program Early CT Score) - Ganglionic level infarction (caudate, lentiform nuclei, internal capsule, insula, M1-M3 cortex): 7 - Supraganglionic infarction (M4-M6 cortex): 3 Total score (0-10 with 10 being normal): 10 IMPRESSION: 1. No acute intracranial infarct or other process identified. 2. ASPECTS is 10 3. Generalized age-related cerebral atrophy with mild chronic small vessel ischemic disease, progressed from 2015. Critical Value/emergent results were called by telephone at the time of interpretation on 02/20/2017 at 10:51 pm to Dr. Nanda Quinton , who verbally acknowledged these results. Electronically Signed   By: Jeannine Boga M.D.   On: 02/20/2017 22:55    PHYSICAL EXAM General: NAD Neck: JVP 14, no thyromegaly or thyroid nodule.  Lungs: CTAB CV: Nondisplaced PMI.  Soft heart sounds, heart regular S1/S2, no S3/S4, no murmur.  1+ edema to knees bilaterally.   Abdomen: Soft, nontender, no hepatosplenomegaly, no distention.  Neurologic: Alert and oriented x 3.  Psych: Normal affect. Extremities: No clubbing or cyanosis.   TELEMETRY: Personally reviewed telemetry pt in NSR with occasional PACs/PVCs  ASSESSMENT AND PLAN: 61 yo admitted with dyspnea and lower extremity edema, found to be in atrial flutter.  He converted back to NSR spontaneously. Found to have biventricular failure by echo, LHC/RHC with nonobstructive CAD, primarily RV failure, and low output.  1. Atrial flutter: Of uncertain duration.  He converted back to NSR spontaneously in the hospital.  NSR today.  - Continue apixaban.  - Would consider eventual EP evaluation for flutter ablation, though appearance was somewhat atypical. - Will use amiodarone to maintain NSR while on milrinone, then stop. Stopped  metoprolol and diltiazem.   2. Acute systolic CHF: Biventricular failure, echo with EF 20%, D-shaped septum, moderately dilated/severely dysfunctional RV.  TSH, HIV normal.  Does not drink ETOH.  Mother with CHF.  Nonobstructive CAD on cath.  RHC with restrictive hemodynamics and prominent RV failure.  Low cardiac output.  Output improved on milrinone 0.25 with co-ox 72% this morning, CVP remains 15 with volume overload on exam.  He diuresed well on current regimen, weight is down.  - Continue milrinone 0.25. - Lasix 80 mg IV every 8 hrs, possible to po tomorrow.  - Continue digoxin  - Increase hydralazine to 25 mg tid, continue Imdur.  - Increase spironolactone to 25 mg daily.  3. CAD: Nonobstructive.  Will give statin.  4. Hypokalemia: Replete.   Loralie Champagne 02/24/2017 7:40 AM

## 2017-02-24 NOTE — Progress Notes (Signed)
    Called by diabetes coordinator because Hg A1c 7. Will start sliding scale insulin. Patient made aware that he is a diabetic.   Angelena Form PA-C  [facility]

## 2017-02-25 DIAGNOSIS — I472 Ventricular tachycardia: Secondary | ICD-10-CM

## 2017-02-25 LAB — CBC
HCT: 45.1 % (ref 39.0–52.0)
Hemoglobin: 15.1 g/dL (ref 13.0–17.0)
MCH: 28.2 pg (ref 26.0–34.0)
MCHC: 33.5 g/dL (ref 30.0–36.0)
MCV: 84.1 fL (ref 78.0–100.0)
Platelets: 281 10*3/uL (ref 150–400)
RBC: 5.36 MIL/uL (ref 4.22–5.81)
RDW: 13.5 % (ref 11.5–15.5)
WBC: 10.6 10*3/uL — ABNORMAL HIGH (ref 4.0–10.5)

## 2017-02-25 LAB — COOXEMETRY PANEL
Carboxyhemoglobin: 0.9 % (ref 0.5–1.5)
Methemoglobin: 1 % (ref 0.0–1.5)
O2 SAT: 57 %
TOTAL HEMOGLOBIN: 15.6 g/dL (ref 12.0–16.0)

## 2017-02-25 LAB — GLUCOSE, CAPILLARY
GLUCOSE-CAPILLARY: 152 mg/dL — AB (ref 65–99)
Glucose-Capillary: 158 mg/dL — ABNORMAL HIGH (ref 65–99)
Glucose-Capillary: 121 mg/dL — ABNORMAL HIGH (ref 65–99)
Glucose-Capillary: 149 mg/dL — ABNORMAL HIGH (ref 65–99)

## 2017-02-25 LAB — BASIC METABOLIC PANEL
Anion gap: 12 (ref 5–15)
BUN: 16 mg/dL (ref 6–20)
CALCIUM: 9 mg/dL (ref 8.9–10.3)
CO2: 32 mmol/L (ref 22–32)
CREATININE: 1.05 mg/dL (ref 0.61–1.24)
Chloride: 94 mmol/L — ABNORMAL LOW (ref 101–111)
GFR calc non Af Amer: >60 mL/min (ref >60)
Glucose, Bld: 160 mg/dL — ABNORMAL HIGH (ref 65–99)
Potassium: 3.7 mmol/L (ref 3.5–5.1)
SODIUM: 138 mmol/L (ref 135–145)

## 2017-02-25 MED ORDER — FUROSEMIDE 10 MG/ML IJ SOLN
80.0000 mg | Freq: Two times a day (BID) | INTRAMUSCULAR | Status: DC
  Administered 2017-02-25 – 2017-02-26 (×2): 80 mg via INTRAVENOUS
  Filled 2017-02-25 (×2): qty 8

## 2017-02-25 MED ORDER — SACUBITRIL-VALSARTAN 24-26 MG PO TABS
1.0000 | ORAL_TABLET | Freq: Two times a day (BID) | ORAL | Status: DC
  Administered 2017-02-25 – 2017-02-26 (×3): 1 via ORAL
  Filled 2017-02-25 (×3): qty 1

## 2017-02-25 NOTE — Progress Notes (Signed)
Patient ID: Corey Carson, male   DOB: 1956-07-09, 61 y.o.   MRN: [id number]   SUBJECTIVE:   Denies dyspnea. Continues to diurese well. Weight down 12 pounds overnight. 24 pounds total.   Seems to be in/out of AFL this am with very frequent PVC and brief NSVT. No chest pain. orthopnea or PND. Doesn't feel PVCs.   Co-ox 57% on milrinone 0.25, CVP 12.  Creatinine stable at 1.05. K 3.  Echo: EF 20%, D-shaped septum with moderate RV dilation/severely decreased RV systolic function.   RHC/LHC:  - Coronary angio with 60% OM1, 40% pLAD RA mean 21 PA 30/11 PCWP mean 19 CI 1.3  Scheduled Meds: . apixaban  5 mg Oral BID  . atorvastatin  20 mg Oral q1800  . digoxin  0.125 mg Oral Daily  . furosemide  80 mg Intravenous TID  . hydrALAZINE  25 mg Oral Q8H  . insulin aspart  0-15 Units Subcutaneous TID WC  . isosorbide mononitrate  30 mg Oral Daily  . polyethylene glycol  17 g Oral Daily  . potassium chloride  40 mEq Oral Daily  . sodium chloride flush  10-40 mL Intracatheter Q12H  . spironolactone  25 mg Oral Daily   Continuous Infusions: . sodium chloride 100 mL/hr (02/21/17 2000)  . amiodarone 30 mg/hr (02/25/17 0301)  . milrinone 0.25 mcg/kg/min (02/25/17 0549)   PRN Meds:.acetaminophen, alum & mag hydroxide-simeth, bisacodyl, loperamide, ondansetron (ZOFRAN) IV, sodium chloride flush    Vitals:   02/24/17 1640 02/24/17 2100 02/24/17 2300 02/25/17 0320  BP: 136/86 (!) 125/91 118/77 125/67  Pulse:  87 82 82  Resp: 20 20 20 19   Temp: 97.9 F (36.6 C) 98 F (36.7 C) 99.6 F (37.6 C) 99 F (37.2 C)  TempSrc: Oral Oral Oral Oral  SpO2: 99% 98% 97% 100%  Weight:    106.8 kg (235 lb 7 oz)  Height:        Intake/Output Summary (Last 24 hours) at 02/25/17 1044 Last data filed at 02/25/17 0939  Gross per 24 hour  Intake          1142.14 ml  Output             5600 ml  Net         -4457.86 ml    LABS: Basic Metabolic Panel:  Recent Labs  02/24/17 0429  02/25/17 0324  NA 133* 138  K 3.1* 3.7  CL 94* 94*  CO2 28 32  GLUCOSE 200* 160*  BUN 21* 16  CREATININE 1.17 1.05  CALCIUM 8.3* 9.0   Liver Function Tests: No results for input(s): AST, ALT, ALKPHOS, BILITOT, PROT, ALBUMIN in the last 72 hours. No results for input(s): LIPASE, AMYLASE in the last 72 hours. CBC:  Recent Labs  02/24/17 0429 02/25/17 0324  WBC 9.9 10.6*  HGB 13.5 15.1  HCT 40.8 45.1  MCV 84.6 84.1  PLT 243 281   Cardiac Enzymes: No results for input(s): CKTOTAL, CKMB, CKMBINDEX, TROPONINI in the last 72 hours. BNP: Invalid input(s): POCBNP D-Dimer: No results for input(s): DDIMER in the last 72 hours. Hemoglobin A1C: No results for input(s): HGBA1C in the last 72 hours. Fasting Lipid Panel: No results for input(s): CHOL, HDL, LDLCALC, TRIG, CHOLHDL, LDLDIRECT in the last 72 hours. Thyroid Function Tests: No results for input(s): TSH, T4TOTAL, T3FREE, THYROIDAB in the last 72 hours.  Invalid input(s): FREET3 Anemia Panel: No results for input(s): VITAMINB12, FOLATE, FERRITIN, TIBC, IRON, RETICCTPCT in the last 72  hours.  RADIOLOGY: Dg Chest 2 View  Result Date: 02/21/2017 CLINICAL DATA:  Right side chest pain. EXAM: CHEST  2 VIEW COMPARISON:  05/19/2013 FINDINGS: Cardiomegaly. Lungs are clear. No effusions. No acute bony abnormality. IMPRESSION: Cardiomegaly.  No active disease. Electronically Signed   By: Rolm Baptise M.D.   On: 02/21/2017 08:25   Ct Head Code Stroke W/o Cm  Result Date: 02/20/2017 CLINICAL DATA:  Code stroke. Initial evaluation for acute left arm numbness. EXAM: CT HEAD WITHOUT CONTRAST TECHNIQUE: Contiguous axial images were obtained from the base of the skull through the vertex without intravenous contrast. COMPARISON:  Prior CT from 08/27/2014. FINDINGS: Brain: Generalized cerebral atrophy with mild chronic small vessel ischemic disease. No acute intracranial hemorrhage. No evidence for acute large vessel territory infarct. No mass  lesion, midline shift or mass effect. No hydrocephalus. No extra-axial fluid collection. Vascular: No hyperdense vessel. Scattered vascular calcifications noted within the carotid siphons. Skull: Scalp soft tissues demonstrate no acute abnormality. Calvarium intact. Sinuses/Orbits: Globes and orbital soft tissues within normal limits. Paranasal sinuses and mastoid air cells are clear. Other: None. ASPECTS [facility] Stroke Program Early CT Score) - Ganglionic level infarction (caudate, lentiform nuclei, internal capsule, insula, M1-M3 cortex): 7 - Supraganglionic infarction (M4-M6 cortex): 3 Total score (0-10 with 10 being normal): 10 IMPRESSION: 1. No acute intracranial infarct or other process identified. 2. ASPECTS is 10 3. Generalized age-related cerebral atrophy with mild chronic small vessel ischemic disease, progressed from 2015. Critical Value/emergent results were called by telephone at the time of interpretation on 02/20/2017 at 10:51 pm to Dr. Nanda Quinton , who verbally acknowledged these results. Electronically Signed   By: Jeannine Boga M.D.   On: 02/20/2017 22:55    PHYSICAL EXAM General: NAD sitting up in bed HEENT: normal anictreic Neck: JVP 12, no thyromegaly or thyroid nodule.  Lungs: CTAB no wheeze CV: Laterally displaced PMI.  Soft heart sounds, irregular 2/6 TR  1-2+ edema to knees bilaterally.   Abdomen: Obese, soft, nontender, no hepatosplenomegaly, no distention.  Neurologic: Alert and oriented x 3.  Moves all 4 without problem Psych: Normal affect. Extremities: No clubbing or cyanosis. Cool   TELEMETRY: NSR but in/out of AFL this am with very frequent PVC and brief NSVT Personally reviewed   ASSESSMENT AND PLAN: 61 yo admitted with dyspnea and lower extremity edema, found to be in atrial flutter.  He converted back to NSR spontaneously. Found to have biventricular failure by echo, LHC/RHC with nonobstructive CAD, primarily RV failure, and low output.  1. Atrial  flutter: Of uncertain duration.  He converted back to NSR spontaneously in the hospital but now in/out. - Continue apixaban 5 bid - Would consider eventual EP evaluation for flutter ablation, though flutter appears somewhat atypical. - Continue amiodarone to maintain NSR while on milrinone, then stop. Stopped metoprolol and diltiazem.   2. Acute systolic CHF: Biventricular failure, echo with EF 20%, D-shaped septum, moderately dilated/severely dysfunctional RV.  TSH, HIV normal.  Does not drink ETOH.  Mother with CHF.  Nonobstructive CAD on cath.  RHC with restrictive hemodynamics and prominent RV failure.  Low cardiac output.   - Output improved on milrinone 0.25 with co-ox 57% this morning, CVP remains 12 today - He is diuresing well on current regimen. Continue IV lasix one more day but swtich to bid - Continue digoxin  - Continue hydralazine 25 mg tid, continue Imdur.  - Continue spironolactone 25 mg daily.  - Add Entresto 24/26 3. CAD: Nonobstructive.   -  Continue statin. No ASA with Eliiquis 4. Hypokalemia:  --supp  5. NSVT --supp K. Check mag.    Glori Bickers MD 02/25/2017 10:44 AM

## 2017-02-26 ENCOUNTER — Inpatient Hospital Stay (HOSPITAL_COMMUNITY): Payer: BLUE CROSS/BLUE SHIELD

## 2017-02-26 DIAGNOSIS — R509 Fever, unspecified: Secondary | ICD-10-CM

## 2017-02-26 LAB — BASIC METABOLIC PANEL
Anion gap: 12 (ref 5–15)
BUN: 20 mg/dL (ref 6–20)
CALCIUM: 8.4 mg/dL — AB (ref 8.9–10.3)
CHLORIDE: 92 mmol/L — AB (ref 101–111)
CO2: 30 mmol/L (ref 22–32)
CREATININE: 1.11 mg/dL (ref 0.61–1.24)
GFR calc non Af Amer: >60 mL/min (ref >60)
Glucose, Bld: 139 mg/dL — ABNORMAL HIGH (ref 65–99)
Potassium: 3.7 mmol/L (ref 3.5–5.1)
SODIUM: 134 mmol/L — AB (ref 135–145)

## 2017-02-26 LAB — GLUCOSE, CAPILLARY
GLUCOSE-CAPILLARY: 228 mg/dL — AB (ref 65–99)
GLUCOSE-CAPILLARY: 140 mg/dL — AB (ref 65–99)
Glucose-Capillary: 195 mg/dL — ABNORMAL HIGH (ref 65–99)
Glucose-Capillary: 147 mg/dL — ABNORMAL HIGH (ref 65–99)

## 2017-02-26 LAB — INFLUENZA PANEL BY PCR (TYPE A & B)
INFLBPCR: POSITIVE — AB
Influenza A By PCR: NEGATIVE

## 2017-02-26 LAB — CBC
HCT: 47 % (ref 39.0–52.0)
Hemoglobin: 15.7 g/dL (ref 13.0–17.0)
MCH: 28.1 pg (ref 26.0–34.0)
MCHC: 33.4 g/dL (ref 30.0–36.0)
MCV: 84.1 fL (ref 78.0–100.0)
PLATELETS: 289 10*3/uL (ref 150–400)
RBC: 5.59 MIL/uL (ref 4.22–5.81)
RDW: 13.2 % (ref 11.5–15.5)
WBC: 9.4 10*3/uL (ref 4.0–10.5)

## 2017-02-26 LAB — COOXEMETRY PANEL
CARBOXYHEMOGLOBIN: 1.4 % (ref 0.5–1.5)
Carboxyhemoglobin: 1 % (ref 0.5–1.5)
Methemoglobin: 1 % (ref 0.0–1.5)
Methemoglobin: 0.7 % (ref 0.0–1.5)
O2 SAT: 49 %
O2 SAT: 47.3 %
TOTAL HEMOGLOBIN: 16.4 g/dL — AB (ref 12.0–16.0)
TOTAL HEMOGLOBIN: 16.4 g/dL — AB (ref 12.0–16.0)

## 2017-02-26 LAB — MAGNESIUM: MAGNESIUM: 1.7 mg/dL (ref 1.7–2.4)

## 2017-02-26 MED ORDER — DEXTROSE 5 % IV SOLN
1.0000 g | INTRAVENOUS | Status: DC
  Administered 2017-02-26: 1 g via INTRAVENOUS
  Filled 2017-02-26: qty 10

## 2017-02-26 MED ORDER — VANCOMYCIN HCL 10 G IV SOLR
2500.0000 mg | Freq: Once | INTRAVENOUS | Status: DC
  Filled 2017-02-26: qty 2500

## 2017-02-26 MED ORDER — VANCOMYCIN HCL IN DEXTROSE 750-5 MG/150ML-% IV SOLN
750.0000 mg | Freq: Two times a day (BID) | INTRAVENOUS | Status: DC
  Filled 2017-02-26 (×2): qty 150

## 2017-02-26 MED ORDER — MAGNESIUM SULFATE 2 GM/50ML IV SOLN
2.0000 g | Freq: Once | INTRAVENOUS | Status: AC
  Administered 2017-02-26: 2 g via INTRAVENOUS
  Filled 2017-02-26: qty 50

## 2017-02-26 MED ORDER — OSELTAMIVIR PHOSPHATE 75 MG PO CAPS
75.0000 mg | ORAL_CAPSULE | Freq: Every day | ORAL | Status: DC
  Administered 2017-02-26 – 2017-02-27 (×2): 75 mg via ORAL
  Filled 2017-02-26 (×2): qty 1

## 2017-02-26 NOTE — Progress Notes (Addendum)
Patient ID: Corey Carson, male   DOB: 11-18-1956, 61 y.o.   MRN: [id number]   SUBJECTIVE:   Feels terrible this am. Up all night with cough and chills. Feels weak. Aches all over. Febrile to 101.  CVP 5 (checked personally). Weight down another pound. No dyspnea. Weight down 25 pounds total.   On tele no further AFL. Frequent PVCs and NSVT.   Co-ox 57%-> 49% on milrinone 0.25.  Creatinine stable at 1.1. K 3.7  Echo: EF 20%, D-shaped septum with moderate RV dilation/severely decreased RV systolic function.   RHC/LHC:  - Coronary angio with 60% OM1, 40% pLAD RA mean 21 PA 30/11 PCWP mean 19 CI 1.3  Scheduled Meds: . apixaban  5 mg Oral BID  . atorvastatin  20 mg Oral q1800  . digoxin  0.125 mg Oral Daily  . furosemide  80 mg Intravenous BID  . hydrALAZINE  25 mg Oral Q8H  . insulin aspart  0-15 Units Subcutaneous TID WC  . isosorbide mononitrate  30 mg Oral Daily  . polyethylene glycol  17 g Oral Daily  . potassium chloride  40 mEq Oral Daily  . sacubitril-valsartan  1 tablet Oral BID  . sodium chloride flush  10-40 mL Intracatheter Q12H  . spironolactone  25 mg Oral Daily   Continuous Infusions: . sodium chloride 100 mL/hr (02/21/17 2000)  . amiodarone 30 mg/hr (02/26/17 0334)  . milrinone 0.25 mcg/kg/min (02/26/17 0334)   PRN Meds:.acetaminophen, alum & mag hydroxide-simeth, bisacodyl, loperamide, ondansetron (ZOFRAN) IV, sodium chloride flush    Vitals:   02/26/17 0100 02/26/17 0509 02/26/17 0807 02/26/17 0814  BP: 124/62 102/69  (!) 116/100  Pulse: 89   88  Resp:    17  Temp: (!) 101.2 F (38.4 C) 98.5 F (36.9 C) 98.7 F (37.1 C)   TempSrc: Oral Oral Oral   SpO2: 97% 98%  98%  Weight: 106.5 kg (234 lb 11.2 oz)     Height:        Intake/Output Summary (Last 24 hours) at 02/26/17 1119 Last data filed at 02/26/17 1012  Gross per 24 hour  Intake          2406.75 ml  Output             4095 ml  Net         -1688.25 ml    LABS: Basic Metabolic  Panel:  Recent Labs  02/25/17 0324 02/26/17 0458  NA 138 134*  K 3.7 3.7  CL 94* 92*  CO2 32 30  GLUCOSE 160* 139*  BUN 16 20  CREATININE 1.05 1.11  CALCIUM 9.0 8.4*  MG  --  1.7   Liver Function Tests: No results for input(s): AST, ALT, ALKPHOS, BILITOT, PROT, ALBUMIN in the last 72 hours. No results for input(s): LIPASE, AMYLASE in the last 72 hours. CBC:  Recent Labs  02/25/17 0324 02/26/17 0458  WBC 10.6* 9.4  HGB 15.1 15.7  HCT 45.1 47.0  MCV 84.1 84.1  PLT 281 289   Cardiac Enzymes: No results for input(s): CKTOTAL, CKMB, CKMBINDEX, TROPONINI in the last 72 hours. BNP: Invalid input(s): POCBNP D-Dimer: No results for input(s): DDIMER in the last 72 hours. Hemoglobin A1C: No results for input(s): HGBA1C in the last 72 hours. Fasting Lipid Panel: No results for input(s): CHOL, HDL, LDLCALC, TRIG, CHOLHDL, LDLDIRECT in the last 72 hours. Thyroid Function Tests: No results for input(s): TSH, T4TOTAL, T3FREE, THYROIDAB in the last 72 hours.  Invalid input(s):  FREET3 Anemia Panel: No results for input(s): VITAMINB12, FOLATE, FERRITIN, TIBC, IRON, RETICCTPCT in the last 72 hours.  RADIOLOGY: Dg Chest 2 View  Result Date: 02/21/2017 CLINICAL DATA:  Right side chest pain. EXAM: CHEST  2 VIEW COMPARISON:  05/19/2013 FINDINGS: Cardiomegaly. Lungs are clear. No effusions. No acute bony abnormality. IMPRESSION: Cardiomegaly.  No active disease. Electronically Signed   By: Rolm Baptise M.D.   On: 02/21/2017 08:25   Ct Head Code Stroke W/o Cm  Result Date: 02/20/2017 CLINICAL DATA:  Code stroke. Initial evaluation for acute left arm numbness. EXAM: CT HEAD WITHOUT CONTRAST TECHNIQUE: Contiguous axial images were obtained from the base of the skull through the vertex without intravenous contrast. COMPARISON:  Prior CT from 08/27/2014. FINDINGS: Brain: Generalized cerebral atrophy with mild chronic small vessel ischemic disease. No acute intracranial hemorrhage. No  evidence for acute large vessel territory infarct. No mass lesion, midline shift or mass effect. No hydrocephalus. No extra-axial fluid collection. Vascular: No hyperdense vessel. Scattered vascular calcifications noted within the carotid siphons. Skull: Scalp soft tissues demonstrate no acute abnormality. Calvarium intact. Sinuses/Orbits: Globes and orbital soft tissues within normal limits. Paranasal sinuses and mastoid air cells are clear. Other: None. ASPECTS [facility] Stroke Program Early CT Score) - Ganglionic level infarction (caudate, lentiform nuclei, internal capsule, insula, M1-M3 cortex): 7 - Supraganglionic infarction (M4-M6 cortex): 3 Total score (0-10 with 10 being normal): 10 IMPRESSION: 1. No acute intracranial infarct or other process identified. 2. ASPECTS is 10 3. Generalized age-related cerebral atrophy with mild chronic small vessel ischemic disease, progressed from 2015. Critical Value/emergent results were called by telephone at the time of interpretation on 02/20/2017 at 10:51 pm to Dr. Nanda Quinton , who verbally acknowledged these results. Electronically Signed   By: Jeannine Boga M.D.   On: 02/20/2017 22:55    PHYSICAL EXAM General: Looks weak. Frequent non-productive cough. Comfortable HEENT: normal anictreic Neck: JVP 5, no thyromegaly or thyroid nodule.  Lungs: Rhonchi bialterally CV: Laterally displaced PMI.  Distant heart sounds, irregular 2/6 TR  No edema. Cool Abdomen: Obese, soft, nontender, no hepatosplenomegaly, nondistended Neurologic: Alert and oriented x 3.  Moves all 4 without problem Psych: Normal affect. Fatigued Extremities: No clubbing or cyanosis. Cool   TELEMETRY: NSR very frequent PVC and brief NSVT. No AFL overnight.  Personally reviewed    ASSESSMENT AND PLAN: 61 yo admitted with dyspnea and lower extremity edema, found to be in atrial flutter.  He converted back to NSR spontaneously. Found to have biventricular failure by echo, LHC/RHC with  nonobstructive CAD, primarily RV failure, and low output.  1. Atrial flutter: Of uncertain duration.  He remains on amio. Yesterday in/out AFL on amio. Today he is in NSR with frequent PVCs and NSVT - Continue amio for now while on milrinone. Continue apixaban 5 bid - Would consider eventual EP evaluation for flutter ablation, though flutter appears somewhat atypical. 2. Acute systolic CHF: Biventricular failure, echo with EF 20%, D-shaped septum, moderately dilated/severely dysfunctional RV.  TSH, HIV normal.  Does not drink ETOH.  Mother with CHF.  Nonobstructive CAD on cath.  RHC with restrictive hemodynamics and prominent RV failure.  Low cardiac output.   - Co-ox back down despite milrinone 0.25. CVP 4-5. Stop lasix. Repeat co-ox. May need to increase inotropes.  - Continue digoxin  - Continue hydralazine 25 mg tid, continue Imdur.  - Continue spironolactone 25 mg daily.  - Continue Entresto 24/26 3. Febrile illness - Looks bad today. I worry he may  have the flu versus PNA.  - Check CXR, flu swab, BCX x 2 - Given how sick he is without reserve I will start vanc and ceftriaxone empirically until more data back 3. CAD: Nonobstructive.   - Continue statin. No ASA with Eliiquis 4. Hypokalemia/hypomagnesemia:  --supp  5. NSVT --supp K and Wenda Overland MD 02/26/2017 11:19 AM   Addendum:  Flu swab + for influenzae B. Will start Tamiflu. Stop abx. Hold entresto and spiro. D/w nursing staff. Droplet precautions initiated.   Glori Bickers, MD  1:42 PM

## 2017-02-26 NOTE — Progress Notes (Signed)
Pharmacy Antibiotic Note  Corey Carson is a 61 y.o. male admitted on 02/20/2017 with pneumonia.  Pharmacy has been consulted for vancomycin and ceftriaxone dosing. -WBC 9.4, fever of 101.26F last 24 hours -SCr 1.11, nCrCl 71  Plan: Vancomycin 2500mg  x1 loading dose, then 750mg  q12h for goal trough 15-20 CTX 1g Q24H (no renal adjustments needed) F/u cultures, renal functions, clinical progress, VT at SS   Height: 5\' 7"  (170.2 cm) Weight: 234 lb 11.2 oz (106.5 kg) IBW/kg (Calculated) : 66.1  Temp (24hrs), Avg:99.2 F (37.3 C), Min:98.2 F (36.8 C), Max:101.2 F (38.4 C)   Recent Labs Lab 02/21/17 0355 02/22/17 0622 02/23/17 0527 02/23/17 0750 02/24/17 0429 02/25/17 0324 02/26/17 0458  WBC  --  12.3* 10.8*  --  9.9 10.6* 9.4  CREATININE 1.07  --   --  1.09 1.17 1.05 1.11    Estimated Creatinine Clearance: 81.4 mL/min (by C-G formula based on SCr of 1.11 mg/dL).    Allergies  Allergen Reactions  . Sulfa Antibiotics Anaphylaxis    Antimicrobials this admission: CTX 3/25 >> Vanc 3/25 >>  Dose adjustments this admission: n/a  Microbiology results: 3/20 MRSA PCR: negative 3/25 BCx: sent 3/25 Flu Panel: sent  Thank you for allowing pharmacy to be a part of this patient's care.  Myer Peer Grayland Ormond), PharmD  [id number] Pharmacy Resident Pager: [phone] 02/26/2017 12:10 PM

## 2017-02-27 LAB — CBC
HCT: 43.4 % (ref 39.0–52.0)
HEMOGLOBIN: 14.6 g/dL (ref 13.0–17.0)
MCH: 28.1 pg (ref 26.0–34.0)
MCHC: 33.6 g/dL (ref 30.0–36.0)
MCV: 83.6 fL (ref 78.0–100.0)
Platelets: 240 10*3/uL (ref 150–400)
RBC: 5.19 MIL/uL (ref 4.22–5.81)
RDW: 13.1 % (ref 11.5–15.5)
WBC: 6.6 10*3/uL (ref 4.0–10.5)

## 2017-02-27 LAB — GLUCOSE, CAPILLARY
GLUCOSE-CAPILLARY: 181 mg/dL — AB (ref 65–99)
GLUCOSE-CAPILLARY: 123 mg/dL — AB (ref 65–99)
Glucose-Capillary: 146 mg/dL — ABNORMAL HIGH (ref 65–99)
Glucose-Capillary: 149 mg/dL — ABNORMAL HIGH (ref 65–99)

## 2017-02-27 LAB — COOXEMETRY PANEL
CARBOXYHEMOGLOBIN: 1.1 % (ref 0.5–1.5)
Carboxyhemoglobin: 1.2 % (ref 0.5–1.5)
METHEMOGLOBIN: 0.9 % (ref 0.0–1.5)
METHEMOGLOBIN: 0.9 % (ref 0.0–1.5)
O2 SAT: 92.3 %
O2 Saturation: 61.3 %
TOTAL HEMOGLOBIN: 15.2 g/dL (ref 12.0–16.0)
TOTAL HEMOGLOBIN: 15.3 g/dL (ref 12.0–16.0)

## 2017-02-27 LAB — BASIC METABOLIC PANEL
ANION GAP: 9 (ref 5–15)
BUN: 20 mg/dL (ref 6–20)
CO2: 28 mmol/L (ref 22–32)
Calcium: 8.1 mg/dL — ABNORMAL LOW (ref 8.9–10.3)
Chloride: 94 mmol/L — ABNORMAL LOW (ref 101–111)
Creatinine, Ser: 1.22 mg/dL (ref 0.61–1.24)
Glucose, Bld: 165 mg/dL — ABNORMAL HIGH (ref 65–99)
POTASSIUM: 4 mmol/L (ref 3.5–5.1)
Sodium: 131 mmol/L — ABNORMAL LOW (ref 135–145)

## 2017-02-27 MED ORDER — SPIRONOLACTONE 25 MG PO TABS
12.5000 mg | ORAL_TABLET | Freq: Every day | ORAL | Status: DC
  Administered 2017-02-27 – 2017-03-01 (×3): 12.5 mg via ORAL
  Filled 2017-02-27 (×3): qty 1

## 2017-02-27 MED ORDER — OSELTAMIVIR PHOSPHATE 75 MG PO CAPS
75.0000 mg | ORAL_CAPSULE | Freq: Two times a day (BID) | ORAL | Status: DC
  Administered 2017-02-27 – 2017-03-03 (×8): 75 mg via ORAL
  Filled 2017-02-27 (×8): qty 1

## 2017-02-27 MED ORDER — FUROSEMIDE 40 MG PO TABS
40.0000 mg | ORAL_TABLET | Freq: Every day | ORAL | Status: DC
  Administered 2017-02-27 – 2017-02-28 (×2): 40 mg via ORAL
  Filled 2017-02-27 (×2): qty 1

## 2017-02-27 NOTE — Progress Notes (Signed)
Patient ID: Corey Carson, male   DOB: 1956/07/18, 61 y.o.   MRN: [id number]   SUBJECTIVE:   Still feels bad.  Max temp yesterday pm 102.7, most recently 100.5.  Influenza B positive, now on oseltamivir.   CVP 6-7. No dyspnea.    Remains in NSR with PVCs.   Co-ox 57%-> 49% on milrinone 0.25.  Milrinone increased to 0.375 but co-ox inaccurate this morning.   Echo: EF 20%, D-shaped septum with moderate RV dilation/severely decreased RV systolic function.   RHC/LHC:  - Coronary angio with 60% OM1, 40% pLAD RA mean 21 PA 30/11 PCWP mean 19 CI 1.3  Scheduled Meds: . apixaban  5 mg Oral BID  . atorvastatin  20 mg Oral q1800  . digoxin  0.125 mg Oral Daily  . furosemide  40 mg Oral Daily  . hydrALAZINE  25 mg Oral Q8H  . insulin aspart  0-15 Units Subcutaneous TID WC  . isosorbide mononitrate  30 mg Oral Daily  . oseltamivir  75 mg Oral Daily  . polyethylene glycol  17 g Oral Daily  . sodium chloride flush  10-40 mL Intracatheter Q12H  . spironolactone  12.5 mg Oral Daily  . vancomycin  750 mg Intravenous Q12H   Continuous Infusions: . sodium chloride 100 mL/hr (02/21/17 2000)  . amiodarone 30 mg/hr (02/26/17 2000)  . milrinone 0.375 mcg/kg/min (02/26/17 2000)   PRN Meds:.acetaminophen, alum & mag hydroxide-simeth, bisacodyl, loperamide, ondansetron (ZOFRAN) IV, sodium chloride flush    Vitals:   02/26/17 2159 02/27/17 0032 02/27/17 0253 02/27/17 0309  BP: (!) 131/56 102/65 93/60 107/68  Pulse: 91 85 91   Resp: 18  18 19   Temp: (!) 102.7 F (39.3 C) 100.2 F (37.9 C) (!) 100.5 F (38.1 C)   TempSrc: Oral Oral Oral   SpO2: 92% 94% 96%   Weight:   234 lb 9.6 oz (106.4 kg)   Height:        Intake/Output Summary (Last 24 hours) at 02/27/17 0740 Last data filed at 02/27/17 0550  Gross per 24 hour  Intake          2212.22 ml  Output             2050 ml  Net           162.22 ml    LABS: Basic Metabolic Panel:  Recent Labs  02/26/17 0458 02/27/17 0525    NA 134* 131*  K 3.7 4.0  CL 92* 94*  CO2 30 28  GLUCOSE 139* 165*  BUN 20 20  CREATININE 1.11 1.22  CALCIUM 8.4* 8.1*  MG 1.7  --    Liver Function Tests: No results for input(s): AST, ALT, ALKPHOS, BILITOT, PROT, ALBUMIN in the last 72 hours. No results for input(s): LIPASE, AMYLASE in the last 72 hours. CBC:  Recent Labs  02/26/17 0458 02/27/17 0525  WBC 9.4 6.6  HGB 15.7 14.6  HCT 47.0 43.4  MCV 84.1 83.6  PLT 289 240   Cardiac Enzymes: No results for input(s): CKTOTAL, CKMB, CKMBINDEX, TROPONINI in the last 72 hours. BNP: Invalid input(s): POCBNP D-Dimer: No results for input(s): DDIMER in the last 72 hours. Hemoglobin A1C: No results for input(s): HGBA1C in the last 72 hours. Fasting Lipid Panel: No results for input(s): CHOL, HDL, LDLCALC, TRIG, CHOLHDL, LDLDIRECT in the last 72 hours. Thyroid Function Tests: No results for input(s): TSH, T4TOTAL, T3FREE, THYROIDAB in the last 72 hours.  Invalid input(s): FREET3 Anemia Panel: No results for  input(s): VITAMINB12, FOLATE, FERRITIN, TIBC, IRON, RETICCTPCT in the last 72 hours.  RADIOLOGY: Dg Chest 2 View  Result Date: 02/21/2017 CLINICAL DATA:  Right side chest pain. EXAM: CHEST  2 VIEW COMPARISON:  05/19/2013 FINDINGS: Cardiomegaly. Lungs are clear. No effusions. No acute bony abnormality. IMPRESSION: Cardiomegaly.  No active disease. Electronically Signed   By: Rolm Baptise M.D.   On: 02/21/2017 08:25   Dg Chest Port 1 View  Result Date: 02/26/2017 CLINICAL DATA:  admitted on 02/20/2017 with pneumonia. EXAM: PORTABLE CHEST 1 VIEW COMPARISON:  02/21/2017 FINDINGS: Patient has right-sided PICC line, tip overlying the level of superior vena cava. Shallow lung inflation. There is enlargement of the cardiac silhouette. There are no focal consolidations or pleural effusions. No pulmonary edema. IMPRESSION: 1. Interval placement of right-sided PICC line. 2. Increased prominence of cardiopericardial silhouette. No  evidence for pericardial effusion on echocardiogram performed earlier. 3. No pulmonary edema. Electronically Signed   By: Nolon Nations M.D.   On: 02/26/2017 15:34   Ct Head Code Stroke W/o Cm  Result Date: 02/20/2017 CLINICAL DATA:  Code stroke. Initial evaluation for acute left arm numbness. EXAM: CT HEAD WITHOUT CONTRAST TECHNIQUE: Contiguous axial images were obtained from the base of the skull through the vertex without intravenous contrast. COMPARISON:  Prior CT from 08/27/2014. FINDINGS: Brain: Generalized cerebral atrophy with mild chronic small vessel ischemic disease. No acute intracranial hemorrhage. No evidence for acute large vessel territory infarct. No mass lesion, midline shift or mass effect. No hydrocephalus. No extra-axial fluid collection. Vascular: No hyperdense vessel. Scattered vascular calcifications noted within the carotid siphons. Skull: Scalp soft tissues demonstrate no acute abnormality. Calvarium intact. Sinuses/Orbits: Globes and orbital soft tissues within normal limits. Paranasal sinuses and mastoid air cells are clear. Other: None. ASPECTS [facility] Stroke Program Early CT Score) - Ganglionic level infarction (caudate, lentiform nuclei, internal capsule, insula, M1-M3 cortex): 7 - Supraganglionic infarction (M4-M6 cortex): 3 Total score (0-10 with 10 being normal): 10 IMPRESSION: 1. No acute intracranial infarct or other process identified. 2. ASPECTS is 10 3. Generalized age-related cerebral atrophy with mild chronic small vessel ischemic disease, progressed from 2015. Critical Value/emergent results were called by telephone at the time of interpretation on 02/20/2017 at 10:51 pm to Dr. Nanda Quinton , who verbally acknowledged these results. Electronically Signed   By: Jeannine Boga M.D.   On: 02/20/2017 22:55    PHYSICAL EXAM General: NAD HEENT: normal  Neck: JVP 7, no thyromegaly or thyroid nodule.  Lungs: Rhonchi bialterally CV: Laterally displaced PMI.   Distant heart sounds, irregular 2/6 TR  No edema.  Abdomen: Obese, soft, nontender, no hepatosplenomegaly, nondistended Neurologic: Alert and oriented x 3.  Moves all 4 without problem Psych: Normal affect. Fatigued Extremities: No clubbing or cyanosis. Cool   TELEMETRY: NSR with frequent PVCs.  Personally reviewed    ASSESSMENT AND PLAN: 61 yo admitted with dyspnea and lower extremity edema, found to be in atrial flutter.  He converted back to NSR spontaneously. Found to have biventricular failure by echo, LHC/RHC with nonobstructive CAD, primarily RV failure, and low output.  1. Atrial flutter: Of uncertain duration.  He remains on amio. Today, he is in NSR with frequent PVCs.  - Continue amio for now while on milrinone. Continue apixaban 5 bid - Would consider eventual EP evaluation for flutter ablation, though flutter appears somewhat atypical. 2. Acute systolic CHF: Biventricular failure, echo with EF 20%, D-shaped septum, moderately dilated/severely dysfunctional RV.  TSH, HIV normal.  Does not  drink ETOH.  Mother with CHF.  Nonobstructive CAD on cath.  RHC with restrictive hemodynamics and prominent RV failure.  Low cardiac output yesterday on milrinone 0.25 so increased to 0.375.  Today's co-ox in accurate.   - Continue milrinone 0.375 for now, resend co-ox.   - Continue digoxin, check level in am.   - Continue hydralazine 25 mg tid, continue Imdur.  - Continue spironolactone 12.5 mg daily.  - Entresto held for now with febrile illness and somewhat lower BP.  - Will start him on po Lasix, 40 mg daily. 3. Febrile illness: Influenza B.  No PNA on CXR.  Blood cultures pending.   - Continue oseltamivir.  3. CAD: Nonobstructive.   - Continue statin. No ASA with Eliiquis 4. NSVT - supplement K and mag   Loralie Champagne MD 02/27/2017 7:40 AM

## 2017-02-28 DIAGNOSIS — J101 Influenza due to other identified influenza virus with other respiratory manifestations: Secondary | ICD-10-CM

## 2017-02-28 LAB — BASIC METABOLIC PANEL
ANION GAP: 11 (ref 5–15)
BUN: 18 mg/dL (ref 6–20)
CHLORIDE: 95 mmol/L — AB (ref 101–111)
CO2: 25 mmol/L (ref 22–32)
Calcium: 8.2 mg/dL — ABNORMAL LOW (ref 8.9–10.3)
Creatinine, Ser: 1.27 mg/dL — ABNORMAL HIGH (ref 0.61–1.24)
GFR, EST NON AFRICAN AMERICAN: 59 mL/min — AB (ref >60)
Glucose, Bld: 115 mg/dL — ABNORMAL HIGH (ref 65–99)
Potassium: 4.4 mmol/L (ref 3.5–5.1)
SODIUM: 131 mmol/L — AB (ref 135–145)

## 2017-02-28 LAB — GLUCOSE, CAPILLARY
GLUCOSE-CAPILLARY: 129 mg/dL — AB (ref 65–99)
GLUCOSE-CAPILLARY: 180 mg/dL — AB (ref 65–99)
GLUCOSE-CAPILLARY: 154 mg/dL — AB (ref 65–99)
Glucose-Capillary: 158 mg/dL — ABNORMAL HIGH (ref 65–99)

## 2017-02-28 LAB — CBC
HCT: 45.7 % (ref 39.0–52.0)
HEMOGLOBIN: 15.3 g/dL (ref 13.0–17.0)
MCH: 28.2 pg (ref 26.0–34.0)
MCHC: 33.5 g/dL (ref 30.0–36.0)
MCV: 84.3 fL (ref 78.0–100.0)
Platelets: 233 10*3/uL (ref 150–400)
RBC: 5.42 MIL/uL (ref 4.22–5.81)
RDW: 13.5 % (ref 11.5–15.5)
WBC: 6.3 10*3/uL (ref 4.0–10.5)

## 2017-02-28 LAB — COOXEMETRY PANEL
Carboxyhemoglobin: 0.8 % (ref 0.5–1.5)
METHEMOGLOBIN: 1 % (ref 0.0–1.5)
O2 Saturation: 59.1 %
Total hemoglobin: 15.8 g/dL (ref 12.0–16.0)

## 2017-02-28 MED ORDER — MILRINONE LACTATE IN DEXTROSE 20-5 MG/100ML-% IV SOLN
0.1250 ug/kg/min | INTRAVENOUS | Status: DC
  Administered 2017-02-28 – 2017-03-01 (×2): 0.25 ug/kg/min via INTRAVENOUS
  Filled 2017-02-28 (×4): qty 100

## 2017-02-28 MED ORDER — SACUBITRIL-VALSARTAN 24-26 MG PO TABS
1.0000 | ORAL_TABLET | Freq: Two times a day (BID) | ORAL | Status: DC
  Administered 2017-02-28 – 2017-03-03 (×7): 1 via ORAL
  Filled 2017-02-28 (×7): qty 1

## 2017-02-28 MED ORDER — GUAIFENESIN-DM 100-10 MG/5ML PO SYRP
5.0000 mL | ORAL_SOLUTION | ORAL | Status: DC | PRN
  Administered 2017-02-28 – 2017-03-01 (×3): 5 mL via ORAL
  Filled 2017-02-28 (×3): qty 5

## 2017-02-28 NOTE — Progress Notes (Signed)
Patient ID: Corey Carson, male   DOB: [birth year], 61 y.o.   MRN: [id number]   SUBJECTIVE:   Starting to feel better.  Tm 101.4.  Influenza B positive, now on oseltamivir.  Blood cultures negative.  CVP 6 this morning, checked personally. No dyspnea walking in halls.    Remains in NSR with PVCs.   Co-ox 59% on milrinone 0.375 today.  Renal function stable.   Echo: EF 20%, D-shaped septum with moderate RV dilation/severely decreased RV systolic function.   RHC/LHC:  - Coronary angio with 60% OM1, 40% pLAD RA mean 21 PA 30/11 PCWP mean 19 CI 1.3  Scheduled Meds: . apixaban  5 mg Oral BID  . atorvastatin  20 mg Oral q1800  . digoxin  0.125 mg Oral Daily  . furosemide  40 mg Oral Daily  . hydrALAZINE  25 mg Oral Q8H  . insulin aspart  0-15 Units Subcutaneous TID WC  . isosorbide mononitrate  30 mg Oral Daily  . oseltamivir  75 mg Oral BID  . polyethylene glycol  17 g Oral Daily  . sacubitril-valsartan  1 tablet Oral BID  . sodium chloride flush  10-40 mL Intracatheter Q12H  . spironolactone  12.5 mg Oral Daily   Continuous Infusions: . sodium chloride 100 mL/hr (02/21/17 2000)  . amiodarone 30 mg/hr (02/28/17 0104)  . milrinone     PRN Meds:.acetaminophen, alum & mag hydroxide-simeth, bisacodyl, loperamide, ondansetron (ZOFRAN) IV, sodium chloride flush    Vitals:   02/27/17 2159 02/28/17 0425 02/28/17 0528 02/28/17 0803  BP: 112/66  112/70 119/89  Pulse: 84  81 76  Resp: 20   (!) 23  Temp: 100.3 F (37.9 C)  (!) 101.4 F (38.6 C) 98.2 F (36.8 C)  TempSrc: Oral  Oral Oral  SpO2: 94%  95% 94%  Weight:  232 lb 12.8 oz (105.6 kg)    Height:        Intake/Output Summary (Last 24 hours) at 02/28/17 0857 Last data filed at 02/28/17 0600  Gross per 24 hour  Intake          5550.18 ml  Output             1176 ml  Net          4374.18 ml    LABS: Basic Metabolic Panel:  Recent Labs  02/26/17 0458 02/27/17 0525 02/28/17 0430  NA 134* 131* 131*  K 3.7  4.0 4.4  CL 92* 94* 95*  CO2 30 28 25   GLUCOSE 139* 165* 115*  BUN 20 20 18   CREATININE 1.11 1.22 1.27*  CALCIUM 8.4* 8.1* 8.2*  MG 1.7  --   --    Liver Function Tests: No results for input(s): AST, ALT, ALKPHOS, BILITOT, PROT, ALBUMIN in the last 72 hours. No results for input(s): LIPASE, AMYLASE in the last 72 hours. CBC:  Recent Labs  02/27/17 0525 02/28/17 0430  WBC 6.6 6.3  HGB 14.6 15.3  HCT 43.4 45.7  MCV 83.6 84.3  PLT 240 233   Cardiac Enzymes: No results for input(s): CKTOTAL, CKMB, CKMBINDEX, TROPONINI in the last 72 hours. BNP: Invalid input(s): POCBNP D-Dimer: No results for input(s): DDIMER in the last 72 hours. Hemoglobin A1C: No results for input(s): HGBA1C in the last 72 hours. Fasting Lipid Panel: No results for input(s): CHOL, HDL, LDLCALC, TRIG, CHOLHDL, LDLDIRECT in the last 72 hours. Thyroid Function Tests: No results for input(s): TSH, T4TOTAL, T3FREE, THYROIDAB in the last 72 hours.  Invalid  input(s): FREET3 Anemia Panel: No results for input(s): VITAMINB12, FOLATE, FERRITIN, TIBC, IRON, RETICCTPCT in the last 72 hours.  RADIOLOGY: Dg Chest 2 View  Result Date: 02/21/2017 CLINICAL DATA:  Right side chest pain. EXAM: CHEST  2 VIEW COMPARISON:  05/19/2013 FINDINGS: Cardiomegaly. Lungs are clear. No effusions. No acute bony abnormality. IMPRESSION: Cardiomegaly.  No active disease. Electronically Signed   By: Rolm Baptise M.D.   On: 02/21/2017 08:25   Dg Chest Port 1 View  Result Date: 02/26/2017 CLINICAL DATA:  admitted on 02/20/2017 with pneumonia. EXAM: PORTABLE CHEST 1 VIEW COMPARISON:  02/21/2017 FINDINGS: Patient has right-sided PICC line, tip overlying the level of superior vena cava. Shallow lung inflation. There is enlargement of the cardiac silhouette. There are no focal consolidations or pleural effusions. No pulmonary edema. IMPRESSION: 1. Interval placement of right-sided PICC line. 2. Increased prominence of cardiopericardial  silhouette. No evidence for pericardial effusion on echocardiogram performed earlier. 3. No pulmonary edema. Electronically Signed   By: Nolon Nations M.D.   On: 02/26/2017 15:34   Ct Head Code Stroke W/o Cm  Result Date: 02/20/2017 CLINICAL DATA:  Code stroke. Initial evaluation for acute left arm numbness. EXAM: CT HEAD WITHOUT CONTRAST TECHNIQUE: Contiguous axial images were obtained from the base of the skull through the vertex without intravenous contrast. COMPARISON:  Prior CT from 08/27/2014. FINDINGS: Brain: Generalized cerebral atrophy with mild chronic small vessel ischemic disease. No acute intracranial hemorrhage. No evidence for acute large vessel territory infarct. No mass lesion, midline shift or mass effect. No hydrocephalus. No extra-axial fluid collection. Vascular: No hyperdense vessel. Scattered vascular calcifications noted within the carotid siphons. Skull: Scalp soft tissues demonstrate no acute abnormality. Calvarium intact. Sinuses/Orbits: Globes and orbital soft tissues within normal limits. Paranasal sinuses and mastoid air cells are clear. Other: None. ASPECTS [facility] Stroke Program Early CT Score) - Ganglionic level infarction (caudate, lentiform nuclei, internal capsule, insula, M1-M3 cortex): 7 - Supraganglionic infarction (M4-M6 cortex): 3 Total score (0-10 with 10 being normal): 10 IMPRESSION: 1. No acute intracranial infarct or other process identified. 2. ASPECTS is 10 3. Generalized age-related cerebral atrophy with mild chronic small vessel ischemic disease, progressed from 2015. Critical Value/emergent results were called by telephone at the time of interpretation on 02/20/2017 at 10:51 pm to Dr. Nanda Quinton , who verbally acknowledged these results. Electronically Signed   By: Jeannine Boga M.D.   On: 02/20/2017 22:55    PHYSICAL EXAM General: NAD HEENT: normal  Neck: JVP 7, no thyromegaly or thyroid nodule.  Lungs: Rhonchi bialterally CV: Laterally  displaced PMI.  Regular S1S2, no S3S4, 2/6 HSM LLSB.  Trace ankle edema.  Abdomen: Obese, soft, nontender, no hepatosplenomegaly, nondistended Neurologic: Alert and oriented x 3.  Moves all 4 without problem Psych: Normal affect. Fatigued Extremities: No clubbing or cyanosis.  TELEMETRY: NSR with occasional PVCs.  Personally reviewed  ASSESSMENT AND PLAN: 61 yo admitted with dyspnea and lower extremity edema, found to be in atrial flutter.  He converted back to NSR spontaneously. Found to have biventricular failure by echo, LHC/RHC with nonobstructive CAD, primarily RV failure, and low output.  1. Atrial flutter: Of uncertain duration.  He remains on amio. Today, he is in NSR with frequent PVCs.  - Continue amio for now while on milrinone, will likely stop after he comes off milrinone. Continue apixaban 5 bid. - Would consider eventual EP evaluation for flutter ablation, though flutter appears somewhat atypical. 2. Acute systolic CHF: Biventricular failure, echo with EF 20%,  D-shaped septum, moderately dilated/severely dysfunctional RV.  TSH, HIV normal.  Does not drink ETOH.  Mother with CHF.  Nonobstructive CAD on cath.  RHC with restrictive hemodynamics and prominent RV failure.  Milrinone 0.375 currently with co-ox 59% this morning.  He has diuresed well, CVP down to 6. - Decrease milrinone to 0.25 mcg/kg/min, co-ox in am.  - Add Entresto 24/26 bid.    - Continue digoxin, check level in am (was not done today).   - Continue hydralazine 25 mg tid, continue Imdur.  - Continue spironolactone 12.5 mg daily.  - Continue Lasix 40 mg po daily.  - Narrow QRS, would not be good CRT candidate.  3. Febrile illness: Influenza B.  No PNA on CXR.  Blood cultures negative so far, antibiotics have been stopped.  Still having fevers.   - Continue oseltamivir.  3. CAD: Nonobstructive.   - Continue statin. No ASA with Eliquis.  4. NSVT: Occasional PVCs, no NSVT.   Loralie Champagne MD 02/28/2017 8:57 AM

## 2017-02-28 NOTE — Plan of Care (Signed)
Problem: Activity: Goal: Risk for activity intolerance will decrease Outcome: Progressing Patient able to ambulate I the room to the restroom, tolerates well.

## 2017-03-01 LAB — GLUCOSE, CAPILLARY
GLUCOSE-CAPILLARY: 159 mg/dL — AB (ref 65–99)
Glucose-Capillary: 246 mg/dL — ABNORMAL HIGH (ref 65–99)
Glucose-Capillary: 167 mg/dL — ABNORMAL HIGH (ref 65–99)

## 2017-03-01 LAB — CBC
HCT: 46.6 % (ref 39.0–52.0)
Hemoglobin: 15.9 g/dL (ref 13.0–17.0)
MCH: 28.4 pg (ref 26.0–34.0)
MCHC: 34.1 g/dL (ref 30.0–36.0)
MCV: 83.2 fL (ref 78.0–100.0)
PLATELETS: 213 10*3/uL (ref 150–400)
RBC: 5.6 MIL/uL (ref 4.22–5.81)
RDW: 13.1 % (ref 11.5–15.5)
WBC: 5.2 10*3/uL (ref 4.0–10.5)

## 2017-03-01 LAB — COOXEMETRY PANEL
Carboxyhemoglobin: 0.6 % (ref 0.5–1.5)
Carboxyhemoglobin: 0.6 % (ref 0.5–1.5)
METHEMOGLOBIN: 0.8 % (ref 0.0–1.5)
Methemoglobin: 0.9 % (ref 0.0–1.5)
O2 Saturation: 54.2 %
O2 Saturation: 59 %
TOTAL HEMOGLOBIN: 16.5 g/dL — AB (ref 12.0–16.0)
TOTAL HEMOGLOBIN: 16.2 g/dL — AB (ref 12.0–16.0)

## 2017-03-01 LAB — BASIC METABOLIC PANEL
Anion gap: 10 (ref 5–15)
BUN: 18 mg/dL (ref 6–20)
CHLORIDE: 97 mmol/L — AB (ref 101–111)
CO2: 24 mmol/L (ref 22–32)
Calcium: 8.2 mg/dL — ABNORMAL LOW (ref 8.9–10.3)
Creatinine, Ser: 1.02 mg/dL (ref 0.61–1.24)
GFR calc Af Amer: >60 mL/min (ref >60)
GFR calc non Af Amer: >60 mL/min (ref >60)
GLUCOSE: 135 mg/dL — AB (ref 65–99)
POTASSIUM: 4.5 mmol/L (ref 3.5–5.1)
Sodium: 131 mmol/L — ABNORMAL LOW (ref 135–145)

## 2017-03-01 LAB — DIGOXIN LEVEL: Digoxin Level: <0.2 ng/mL — ABNORMAL LOW (ref 0.8–2.0)

## 2017-03-01 LAB — MAGNESIUM: Magnesium: 1.8 mg/dL (ref 1.7–2.4)

## 2017-03-01 MED ORDER — MAGNESIUM SULFATE 2 GM/50ML IV SOLN
2.0000 g | Freq: Once | INTRAVENOUS | Status: AC
  Administered 2017-03-01: 2 g via INTRAVENOUS
  Filled 2017-03-01: qty 50

## 2017-03-01 MED ORDER — AMIODARONE HCL 200 MG PO TABS
400.0000 mg | ORAL_TABLET | Freq: Two times a day (BID) | ORAL | Status: DC
  Administered 2017-03-01 – 2017-03-03 (×5): 400 mg via ORAL
  Filled 2017-03-01 (×5): qty 2

## 2017-03-01 NOTE — Progress Notes (Signed)
CARDIAC REHAB PHASE I   PRE:  Rate/Rhythm: 82 SR    BP: sitting 108/81    SaO2:   MODE:  Ambulation: 550 ft   POST:  Rate/Rhythm: 88 SR with PVCs    BP: sitting 115/78     SaO2: 97 RA  Pt walking well, no c/o. HR stable. He has also been walking independently and with staff. Sts he likes to walk. Ed discussed including HF (booklet, low sodium, daily wts, exercise). Pt very receptive. Also discussed DM somewhat and CRPII. Will refer to [place]. Pt is very eager to know if/when he can go back to work. He has 3 more years to retire. Will f/u as low priority as he is walking independently. [place], ACSM 03/01/2017 3:04 PM

## 2017-03-01 NOTE — Progress Notes (Signed)
Patient ID: Corey Carson, male   DOB: Apr 21, 1956, 61 y.o.   MRN: [id number]   SUBJECTIVE:    Influenza B positive, now on oseltamivir.  Blood cultures negative. Febrile over night 102.   Yesterday milrinone was cut back to 0.25 mcg and entresto was started. Todays CO-OX is 54%.   Denies SOB. Feeling better.    Echo: EF 20%, D-shaped septum with moderate RV dilation/severely decreased RV systolic function.   RHC/LHC:  - Coronary angio with 60% OM1, 40% pLAD RA mean 21 PA 30/11 PCWP mean 19 CI 1.3  Scheduled Meds: . apixaban  5 mg Oral BID  . atorvastatin  20 mg Oral q1800  . digoxin  0.125 mg Oral Daily  . furosemide  40 mg Oral Daily  . hydrALAZINE  25 mg Oral Q8H  . insulin aspart  0-15 Units Subcutaneous TID WC  . isosorbide mononitrate  30 mg Oral Daily  . oseltamivir  75 mg Oral BID  . polyethylene glycol  17 g Oral Daily  . sacubitril-valsartan  1 tablet Oral BID  . sodium chloride flush  10-40 mL Intracatheter Q12H  . spironolactone  12.5 mg Oral Daily   Continuous Infusions: . sodium chloride 100 mL/hr (02/21/17 2000)  . amiodarone 30 mg/hr (03/01/17 0340)  . milrinone 0.25 mcg/kg/min (03/01/17 0339)   PRN Meds:.acetaminophen, alum & mag hydroxide-simeth, bisacodyl, guaiFENesin-dextromethorphan, loperamide, ondansetron (ZOFRAN) IV, sodium chloride flush    Vitals:   03/01/17 0437 03/01/17 0439 03/01/17 0501 03/01/17 0724  BP:  109/75  113/71  Pulse:  84  74  Resp:  (!) 21  18  Temp:   98.1 F (36.7 C) 97.7 F (36.5 C)  TempSrc:   Oral Oral  SpO2:  98%  98%  Weight: 229 lb (103.9 kg)     Height:        Intake/Output Summary (Last 24 hours) at 03/01/17 0729 Last data filed at 03/01/17 0600  Gross per 24 hour  Intake            976.3 ml  Output             1275 ml  Net           -298.7 ml    LABS: Basic Metabolic Panel:  Recent Labs  02/28/17 0430 03/01/17 0400  NA 131* 131*  K 4.4 4.5  CL 95* 97*  CO2 25 24  GLUCOSE 115* 135*  BUN  18 18  CREATININE 1.27* 1.02  CALCIUM 8.2* 8.2*   Liver Function Tests: No results for input(s): AST, ALT, ALKPHOS, BILITOT, PROT, ALBUMIN in the last 72 hours. No results for input(s): LIPASE, AMYLASE in the last 72 hours. CBC:  Recent Labs  02/28/17 0430 03/01/17 0400  WBC 6.3 5.2  HGB 15.3 15.9  HCT 45.7 46.6  MCV 84.3 83.2  PLT 233 213   Cardiac Enzymes: No results for input(s): CKTOTAL, CKMB, CKMBINDEX, TROPONINI in the last 72 hours. BNP: Invalid input(s): POCBNP D-Dimer: No results for input(s): DDIMER in the last 72 hours. Hemoglobin A1C: No results for input(s): HGBA1C in the last 72 hours. Fasting Lipid Panel: No results for input(s): CHOL, HDL, LDLCALC, TRIG, CHOLHDL, LDLDIRECT in the last 72 hours. Thyroid Function Tests: No results for input(s): TSH, T4TOTAL, T3FREE, THYROIDAB in the last 72 hours.  Invalid input(s): FREET3 Anemia Panel: No results for input(s): VITAMINB12, FOLATE, FERRITIN, TIBC, IRON, RETICCTPCT in the last 72 hours.  RADIOLOGY: Dg Chest 2 View  Result Date: 02/21/2017  CLINICAL DATA:  Right side chest pain. EXAM: CHEST  2 VIEW COMPARISON:  05/19/2013 FINDINGS: Cardiomegaly. Lungs are clear. No effusions. No acute bony abnormality. IMPRESSION: Cardiomegaly.  No active disease. Electronically Signed   By: Rolm Baptise M.D.   On: 02/21/2017 08:25   Dg Chest Port 1 View  Result Date: 02/26/2017 CLINICAL DATA:  admitted on 02/20/2017 with pneumonia. EXAM: PORTABLE CHEST 1 VIEW COMPARISON:  02/21/2017 FINDINGS: Patient has right-sided PICC line, tip overlying the level of superior vena cava. Shallow lung inflation. There is enlargement of the cardiac silhouette. There are no focal consolidations or pleural effusions. No pulmonary edema. IMPRESSION: 1. Interval placement of right-sided PICC line. 2. Increased prominence of cardiopericardial silhouette. No evidence for pericardial effusion on echocardiogram performed earlier. 3. No pulmonary edema.  Electronically Signed   By: Nolon Nations M.D.   On: 02/26/2017 15:34   Ct Head Code Stroke W/o Cm  Result Date: 02/20/2017 CLINICAL DATA:  Code stroke. Initial evaluation for acute left arm numbness. EXAM: CT HEAD WITHOUT CONTRAST TECHNIQUE: Contiguous axial images were obtained from the base of the skull through the vertex without intravenous contrast. COMPARISON:  Prior CT from 08/27/2014. FINDINGS: Brain: Generalized cerebral atrophy with mild chronic small vessel ischemic disease. No acute intracranial hemorrhage. No evidence for acute large vessel territory infarct. No mass lesion, midline shift or mass effect. No hydrocephalus. No extra-axial fluid collection. Vascular: No hyperdense vessel. Scattered vascular calcifications noted within the carotid siphons. Skull: Scalp soft tissues demonstrate no acute abnormality. Calvarium intact. Sinuses/Orbits: Globes and orbital soft tissues within normal limits. Paranasal sinuses and mastoid air cells are clear. Other: None. ASPECTS [facility] Stroke Program Early CT Score) - Ganglionic level infarction (caudate, lentiform nuclei, internal capsule, insula, M1-M3 cortex): 7 - Supraganglionic infarction (M4-M6 cortex): 3 Total score (0-10 with 10 being normal): 10 IMPRESSION: 1. No acute intracranial infarct or other process identified. 2. ASPECTS is 10 3. Generalized age-related cerebral atrophy with mild chronic small vessel ischemic disease, progressed from 2015. Critical Value/emergent results were called by telephone at the time of interpretation on 02/20/2017 at 10:51 pm to Dr. Nanda Quinton , who verbally acknowledged these results. Electronically Signed   By: Jeannine Boga M.D.   On: 02/20/2017 22:55    PHYSICAL EXAM CVP 1 General: NAD HEENT: normal  Neck: JVP flat no thyromegaly or thyroid nodule.  Lungs: Coarse throughout. On room air.  CV: Laterally displaced PMI.  Regular S1S2, no S3S4, 2/6 HSM LLSB.  No edema.  Abdomen: Obese, soft,  nontender, no hepatosplenomegaly, nondistended Neurologic: Alert and oriented x 3.  Moves all 4 without problem Psych: Normal affect. Fatigued Extremities: No clubbing or cyanosis. RUE PICC  TELEMETRY: NSR with occasional PVCs. 70-80s  Personally reviewed  ASSESSMENT AND PLAN: 61 yo admitted with dyspnea and lower extremity edema, found to be in atrial flutter.  He converted back to NSR spontaneously. Found to have biventricular failure by echo, LHC/RHC with nonobstructive CAD, primarily RV failure, and low output.  1. Atrial flutter: Of uncertain duration.  He remains on amio. Today, he is in NSR with frequent PVCs.  - Continue amio for now while on milrinone. With decrease in milrinone, can transition to po amiodarone then will stop when off milrinone.  Continue apixaban 5 bid. - Would consider eventual EP evaluation for flutter ablation, though flutter appears somewhat atypical. 2. Acute systolic CHF: Biventricular failure, echo with EF 20%, D-shaped septum, moderately dilated/severely dysfunctional RV.  TSH, HIV normal.  Does not  drink ETOH.  Mother with CHF.  Nonobstructive CAD on cath.  RHC with restrictive hemodynamics and prominent RV failure.  Milrinone 0.25 currently with co-ox 54% this morning.  CVP 1, not volume overloaded.  Renal function stable.  - He feels great despite marginal co-ox.  Cut back milrinone to 0.125 mcg. Repeat CO-OX at 1100.  - Hold Lasix today with low CVP.   - No bb with low output. Will not increase meds with low SBP over night.  - Continue Entresto 24/26 bid.   - Continue digoxin, level <0.2    - Continue hydralazine 25 mg tid, continue Imdur.  - Continue spiro 12.5 mg daily  - Narrow QRS, would not be good CRT candidate.  3. Febrile illness: Influenza B.  No PNA on CXR.  Blood cultures negative so far, antibiotics have been stopped.  WBCs not elevated.  Fever over night 102.    - Continue oseltamivir.  3. CAD: Nonobstructive.   - Continue statin. No ASA  with Eliquis.  4. NSVT: Occasional PVCs, no NSVT. K 4.5 Mag 1.7 on 3/25 . Check Mag now. If less than 2 will replace.  5. Hyponatremia- sodium 131  Consult cardia rehab.   Amy Clegg NP-C  03/01/2017 7:29 AM   Patient seen with NP, agree with the above note.  He looks good and is feeling better though still febrile overnight.  Despite marginal co-ox, will try dropping back on milrinone to 0.125.  Hope to get off this medication eventually.  Does not have much room to titrate other meds, continue current doses.  With decrease in milrinone will make amiodarone po.  Plan to stop amiodarone when off milrinone.   CVP 1, hold Lasix today.   Loralie Champagne 03/01/2017 8:28 AM

## 2017-03-02 LAB — CBC
HEMATOCRIT: 47.2 % (ref 39.0–52.0)
Hemoglobin: 15.7 g/dL (ref 13.0–17.0)
MCH: 27.7 pg (ref 26.0–34.0)
MCHC: 33.3 g/dL (ref 30.0–36.0)
MCV: 83.2 fL (ref 78.0–100.0)
PLATELETS: 203 10*3/uL (ref 150–400)
RBC: 5.67 MIL/uL (ref 4.22–5.81)
RDW: 13.5 % (ref 11.5–15.5)
WBC: 4.5 10*3/uL (ref 4.0–10.5)

## 2017-03-02 LAB — GLUCOSE, CAPILLARY
GLUCOSE-CAPILLARY: 135 mg/dL — AB (ref 65–99)
Glucose-Capillary: 166 mg/dL — ABNORMAL HIGH (ref 65–99)
Glucose-Capillary: 192 mg/dL — ABNORMAL HIGH (ref 65–99)
Glucose-Capillary: 132 mg/dL — ABNORMAL HIGH (ref 65–99)
Glucose-Capillary: 115 mg/dL — ABNORMAL HIGH (ref 65–99)

## 2017-03-02 LAB — COOXEMETRY PANEL
CARBOXYHEMOGLOBIN: 0.5 % (ref 0.5–1.5)
Methemoglobin: 0.9 % (ref 0.0–1.5)
O2 Saturation: 76.6 %
Total hemoglobin: 16.3 g/dL — ABNORMAL HIGH (ref 12.0–16.0)

## 2017-03-02 LAB — BASIC METABOLIC PANEL
ANION GAP: 9 (ref 5–15)
BUN: 19 mg/dL (ref 6–20)
CHLORIDE: 104 mmol/L (ref 101–111)
CO2: 23 mmol/L (ref 22–32)
Calcium: 8 mg/dL — ABNORMAL LOW (ref 8.9–10.3)
Creatinine, Ser: 1.06 mg/dL (ref 0.61–1.24)
GFR calc non Af Amer: >60 mL/min (ref >60)
Glucose, Bld: 133 mg/dL — ABNORMAL HIGH (ref 65–99)
POTASSIUM: 4.4 mmol/L (ref 3.5–5.1)
Sodium: 136 mmol/L (ref 135–145)

## 2017-03-02 LAB — MAGNESIUM: Magnesium: 1.9 mg/dL (ref 1.7–2.4)

## 2017-03-02 MED ORDER — MAGNESIUM SULFATE 2 GM/50ML IV SOLN
2.0000 g | Freq: Once | INTRAVENOUS | Status: AC
  Administered 2017-03-02: 2 g via INTRAVENOUS
  Filled 2017-03-02: qty 50

## 2017-03-02 MED ORDER — SPIRONOLACTONE 25 MG PO TABS
25.0000 mg | ORAL_TABLET | Freq: Every day | ORAL | Status: DC
  Administered 2017-03-02 – 2017-03-03 (×2): 25 mg via ORAL
  Filled 2017-03-02 (×2): qty 1

## 2017-03-02 MED ORDER — FUROSEMIDE 40 MG PO TABS
40.0000 mg | ORAL_TABLET | Freq: Every day | ORAL | Status: DC
  Administered 2017-03-02 – 2017-03-03 (×2): 40 mg via ORAL
  Filled 2017-03-02 (×2): qty 1

## 2017-03-02 NOTE — Progress Notes (Signed)
Pt has already walked and sts he will walk again later. Had teach back discussion with pt. He was not able to remember numbers but is getting general idea of managing his heart. He is also making good decisions with his diet regarding DM (sent sugary food back at lunch). I reinforced numbers (sodium, daily wts).  [place], ACSM 1:46 PM 03/02/2017

## 2017-03-02 NOTE — Progress Notes (Signed)
Patient ID: Corey Carson, male   DOB: [birth year], 61 y.o.   MRN: [id number]   SUBJECTIVE:  Influenza B positive, now on oseltamivir.  Blood cultures negative. Now afebrile.   Yesterday milrinone was cut back to 0.125 mcg/kg/min.  Co-ox today is 77%, CVP 4-6.   Denies SOB. Walking in hall.     Echo: EF 20%, D-shaped septum with moderate RV dilation/severely decreased RV systolic function.   RHC/LHC:  - Coronary angio with 60% OM1, 40% pLAD RA mean 21 PA 30/11 PCWP mean 19 CI 1.3  Scheduled Meds: . amiodarone  400 mg Oral BID  . apixaban  5 mg Oral BID  . atorvastatin  20 mg Oral q1800  . digoxin  0.125 mg Oral Daily  . furosemide  40 mg Oral Daily  . hydrALAZINE  25 mg Oral Q8H  . insulin aspart  0-15 Units Subcutaneous TID WC  . isosorbide mononitrate  30 mg Oral Daily  . oseltamivir  75 mg Oral BID  . polyethylene glycol  17 g Oral Daily  . sacubitril-valsartan  1 tablet Oral BID  . sodium chloride flush  10-40 mL Intracatheter Q12H  . spironolactone  12.5 mg Oral Daily   Continuous Infusions: . sodium chloride 100 mL/hr (02/21/17 2000)   PRN Meds:.acetaminophen, alum & mag hydroxide-simeth, bisacodyl, guaiFENesin-dextromethorphan, loperamide, ondansetron (ZOFRAN) IV, sodium chloride flush    Vitals:   03/01/17 2015 03/01/17 2359 03/02/17 0507 03/02/17 0509  BP:  111/65  108/73  Pulse:  71  81  Resp: (!) 23 (!) 21 (!) 23 20  Temp:  99.8 F (37.7 C)  98.4 F (36.9 C)  TempSrc:  Oral  Oral  SpO2:  96%  97%  Weight:   229 lb 12.8 oz (104.2 kg)   Height:        Intake/Output Summary (Last 24 hours) at 03/02/17 0737 Last data filed at 03/02/17 0511  Gross per 24 hour  Intake            393.4 ml  Output              900 ml  Net           -506.6 ml    LABS: Basic Metabolic Panel:  Recent Labs  03/01/17 0400 03/02/17 0500  NA 131* 136  K 4.5 4.4  CL 97* 104  CO2 24 23  GLUCOSE 135* 133*  BUN 18 19  CREATININE 1.02 1.06  CALCIUM 8.2* 8.0*  MG  1.8 1.9   Liver Function Tests: No results for input(s): AST, ALT, ALKPHOS, BILITOT, PROT, ALBUMIN in the last 72 hours. No results for input(s): LIPASE, AMYLASE in the last 72 hours. CBC:  Recent Labs  03/01/17 0400 03/02/17 0500  WBC 5.2 4.5  HGB 15.9 15.7  HCT 46.6 47.2  MCV 83.2 83.2  PLT 213 203   Cardiac Enzymes: No results for input(s): CKTOTAL, CKMB, CKMBINDEX, TROPONINI in the last 72 hours. BNP: Invalid input(s): POCBNP D-Dimer: No results for input(s): DDIMER in the last 72 hours. Hemoglobin A1C: No results for input(s): HGBA1C in the last 72 hours. Fasting Lipid Panel: No results for input(s): CHOL, HDL, LDLCALC, TRIG, CHOLHDL, LDLDIRECT in the last 72 hours. Thyroid Function Tests: No results for input(s): TSH, T4TOTAL, T3FREE, THYROIDAB in the last 72 hours.  Invalid input(s): FREET3 Anemia Panel: No results for input(s): VITAMINB12, FOLATE, FERRITIN, TIBC, IRON, RETICCTPCT in the last 72 hours.  RADIOLOGY: Dg Chest 2 View  Result Date: 02/21/2017  CLINICAL DATA:  Right side chest pain. EXAM: CHEST  2 VIEW COMPARISON:  05/19/2013 FINDINGS: Cardiomegaly. Lungs are clear. No effusions. No acute bony abnormality. IMPRESSION: Cardiomegaly.  No active disease. Electronically Signed   By: Rolm Baptise M.D.   On: 02/21/2017 08:25   Dg Chest Port 1 View  Result Date: 02/26/2017 CLINICAL DATA:  admitted on 02/20/2017 with pneumonia. EXAM: PORTABLE CHEST 1 VIEW COMPARISON:  02/21/2017 FINDINGS: Patient has right-sided PICC line, tip overlying the level of superior vena cava. Shallow lung inflation. There is enlargement of the cardiac silhouette. There are no focal consolidations or pleural effusions. No pulmonary edema. IMPRESSION: 1. Interval placement of right-sided PICC line. 2. Increased prominence of cardiopericardial silhouette. No evidence for pericardial effusion on echocardiogram performed earlier. 3. No pulmonary edema. Electronically Signed   By: Nolon Nations  M.D.   On: 02/26/2017 15:34   Ct Head Code Stroke W/o Cm  Result Date: 02/20/2017 CLINICAL DATA:  Code stroke. Initial evaluation for acute left arm numbness. EXAM: CT HEAD WITHOUT CONTRAST TECHNIQUE: Contiguous axial images were obtained from the base of the skull through the vertex without intravenous contrast. COMPARISON:  Prior CT from 08/27/2014. FINDINGS: Brain: Generalized cerebral atrophy with mild chronic small vessel ischemic disease. No acute intracranial hemorrhage. No evidence for acute large vessel territory infarct. No mass lesion, midline shift or mass effect. No hydrocephalus. No extra-axial fluid collection. Vascular: No hyperdense vessel. Scattered vascular calcifications noted within the carotid siphons. Skull: Scalp soft tissues demonstrate no acute abnormality. Calvarium intact. Sinuses/Orbits: Globes and orbital soft tissues within normal limits. Paranasal sinuses and mastoid air cells are clear. Other: None. ASPECTS [facility] Stroke Program Early CT Score) - Ganglionic level infarction (caudate, lentiform nuclei, internal capsule, insula, M1-M3 cortex): 7 - Supraganglionic infarction (M4-M6 cortex): 3 Total score (0-10 with 10 being normal): 10 IMPRESSION: 1. No acute intracranial infarct or other process identified. 2. ASPECTS is 10 3. Generalized age-related cerebral atrophy with mild chronic small vessel ischemic disease, progressed from 2015. Critical Value/emergent results were called by telephone at the time of interpretation on 02/20/2017 at 10:51 pm to Dr. Nanda Quinton , who verbally acknowledged these results. Electronically Signed   By: Jeannine Boga M.D.   On: 02/20/2017 22:55    PHYSICAL EXAM CVP 4-6 General: NAD HEENT: normal  Neck: JVP 7 cm.  No thyromegaly or thyroid nodule.  Lungs: Coarse throughout. On room air.  CV: Laterally displaced PMI.  Regular S1S2, no S3S4, 1/6 HSM LLSB.  No edema.  Abdomen: Obese, soft, nontender, no hepatosplenomegaly,  nondistended Neurologic: Alert and oriented x 3.  Moves all 4 without problem Psych: Normal affect. Fatigued Extremities: No clubbing or cyanosis. RUE PICC  TELEMETRY: NSR with occasional PVCs. 97s  Personally reviewed  ASSESSMENT AND PLAN: 61 yo admitted with dyspnea and lower extremity edema, found to be in atrial flutter.  He converted back to NSR spontaneously. Found to have biventricular failure by echo, LHC/RHC with nonobstructive CAD, primarily RV failure, and low output.  1. Atrial flutter: Of uncertain duration.  He remains on amio. Today, he is in NSR.  - Stopping milrinone today.  Will stop amiodarone tomorrow.  - Continue apixaban.  - Would consider eventual EP evaluation for flutter ablation, though flutter appears somewhat atypical. 2. Acute systolic CHF: Biventricular failure, echo with EF 20%, D-shaped septum, moderately dilated/severely dysfunctional RV.  TSH, HIV normal.  Does not drink ETOH.  Mother with CHF.  Nonobstructive CAD on cath.  RHC with restrictive  hemodynamics and prominent RV failure.  Milrinone 0.125 currently with co-ox 77% this morning.  CVP 4-6, not volume overloaded.  Renal function stable.  - Stop milrinone today, repeat co-ox in am.  - He can start Lasix 40 mg po daily for home.   - No bb with low output. - Continue Entresto 24/26 bid.   - Continue digoxin, level <0.2    - Continue hydralazine 25 mg tid, continue Imdur.  - Can increase spironolactone to 25 mg daily.  - Narrow QRS, would not be good CRT candidate.  3. Febrile illness: Influenza B.  No PNA on CXR.  Blood cultures negative so far, antibiotics have been stopped.  WBCs not elevated.  Now afebrile.     - Continue oseltamivir.  3. CAD: Nonobstructive.   - Continue statin. No ASA with Eliquis.  4. NSVT: Occasional PVCs, no NSVT.   Corey Carson   03/02/2017 7:37 AM

## 2017-03-03 LAB — BASIC METABOLIC PANEL
ANION GAP: 9 (ref 5–15)
BUN: 17 mg/dL (ref 6–20)
CO2: 21 mmol/L — ABNORMAL LOW (ref 22–32)
Calcium: 8 mg/dL — ABNORMAL LOW (ref 8.9–10.3)
Chloride: 105 mmol/L (ref 101–111)
Creatinine, Ser: 1.06 mg/dL (ref 0.61–1.24)
Glucose, Bld: 115 mg/dL — ABNORMAL HIGH (ref 65–99)
POTASSIUM: 4.6 mmol/L (ref 3.5–5.1)
SODIUM: 135 mmol/L (ref 135–145)

## 2017-03-03 LAB — CBC
HEMATOCRIT: 46.1 % (ref 39.0–52.0)
HEMOGLOBIN: 15.8 g/dL (ref 13.0–17.0)
MCH: 28.4 pg (ref 26.0–34.0)
MCHC: 34.3 g/dL (ref 30.0–36.0)
MCV: 82.9 fL (ref 78.0–100.0)
Platelets: 181 10*3/uL (ref 150–400)
RBC: 5.56 MIL/uL (ref 4.22–5.81)
RDW: 13.2 % (ref 11.5–15.5)
WBC: 5.6 10*3/uL (ref 4.0–10.5)

## 2017-03-03 LAB — COOXEMETRY PANEL
Carboxyhemoglobin: 1.2 % (ref 0.5–1.5)
METHEMOGLOBIN: 0.6 % (ref 0.0–1.5)
O2 Saturation: 61.6 %
Total hemoglobin: 16.6 g/dL — ABNORMAL HIGH (ref 12.0–16.0)

## 2017-03-03 LAB — CULTURE, BLOOD (ROUTINE X 2)
CULTURE: NO GROWTH
Culture: NO GROWTH

## 2017-03-03 LAB — MAGNESIUM: MAGNESIUM: 1.9 mg/dL (ref 1.7–2.4)

## 2017-03-03 LAB — GLUCOSE, CAPILLARY: GLUCOSE-CAPILLARY: 116 mg/dL — AB (ref 65–99)

## 2017-03-03 MED ORDER — APIXABAN 5 MG PO TABS: 5.0000 mg | ORAL_TABLET | Freq: Two times a day (BID) | ORAL | 6 refills | Status: DC

## 2017-03-03 MED ORDER — ISOSORBIDE MONONITRATE ER 30 MG PO TB24: 30.0000 mg | ORAL_TABLET | Freq: Every day | ORAL | 6 refills | Status: AC

## 2017-03-03 MED ORDER — HYDRALAZINE HCL 25 MG PO TABS: 25.0000 mg | ORAL_TABLET | Freq: Three times a day (TID) | ORAL | 6 refills | Status: AC

## 2017-03-03 MED ORDER — FUROSEMIDE 40 MG PO TABS: 40.0000 mg | ORAL_TABLET | Freq: Every day | ORAL | 6 refills | Status: AC

## 2017-03-03 MED ORDER — ISOSORBIDE MONONITRATE ER 30 MG PO TB24: 30.0000 mg | ORAL_TABLET | Freq: Every day | ORAL | 6 refills | Status: DC

## 2017-03-03 MED ORDER — APIXABAN 5 MG PO TABS: 5.0000 mg | ORAL_TABLET | Freq: Two times a day (BID) | ORAL | 6 refills | Status: AC

## 2017-03-03 MED ORDER — SPIRONOLACTONE 25 MG PO TABS: 25.0000 mg | ORAL_TABLET | Freq: Every day | ORAL | 6 refills | Status: DC

## 2017-03-03 MED ORDER — SACUBITRIL-VALSARTAN 24-26 MG PO TABS: 1.0000 | ORAL_TABLET | Freq: Two times a day (BID) | ORAL | 6 refills | Status: AC

## 2017-03-03 MED ORDER — SPIRONOLACTONE 25 MG PO TABS: 25.0000 mg | ORAL_TABLET | Freq: Every day | ORAL | 6 refills | Status: AC

## 2017-03-03 MED ORDER — HYDRALAZINE HCL 25 MG PO TABS: 25.0000 mg | ORAL_TABLET | Freq: Three times a day (TID) | ORAL | 6 refills | Status: DC

## 2017-03-03 MED ORDER — SACUBITRIL-VALSARTAN 24-26 MG PO TABS: 1.0000 | ORAL_TABLET | Freq: Two times a day (BID) | ORAL | 6 refills | Status: DC

## 2017-03-03 MED ORDER — DIGOXIN 125 MCG PO TABS: 0.1250 mg | ORAL_TABLET | Freq: Every day | ORAL | 6 refills | Status: AC

## 2017-03-03 MED ORDER — ATORVASTATIN CALCIUM 20 MG PO TABS: 20.0000 mg | ORAL_TABLET | Freq: Every day | ORAL | 6 refills | Status: DC

## 2017-03-03 MED ORDER — ATORVASTATIN CALCIUM 20 MG PO TABS: 20.0000 mg | ORAL_TABLET | Freq: Every day | ORAL | 6 refills | Status: AC

## 2017-03-03 MED ORDER — FUROSEMIDE 40 MG PO TABS: 40.0000 mg | ORAL_TABLET | Freq: Every day | ORAL | 6 refills | Status: DC

## 2017-03-03 MED ORDER — DIGOXIN 125 MCG PO TABS: 0.1250 mg | ORAL_TABLET | Freq: Every day | ORAL | 6 refills | Status: DC

## 2017-03-03 MED FILL — ATORVASTATIN 20 MG TABLET: 20 | 30 days supply | Qty: 30 | Fill #0

## 2017-03-03 MED FILL — DIGITEK 125 MCG TABLET: 125 | 30 days supply | Qty: 30 | Fill #0

## 2017-03-03 MED FILL — hydrALAZINE HCL 25 MG TABS: 25 | 30 days supply | Qty: 90 | Fill #0

## 2017-03-03 MED FILL — ISOSORBIDE MN ER 30 MG TAB: 30 | 30 days supply | Qty: 30 | Fill #0

## 2017-03-03 MED FILL — FUROSEMIDE 40 MG TABLET: 40 | 30 days supply | Qty: 30 | Fill #0

## 2017-03-03 MED FILL — ENTRESTO 24 MG-26 MG TABLET: 24-26 | 30 days supply | Qty: 60 | Fill #0

## 2017-03-03 MED FILL — ELIQUIS 5 MG TABLET: 5 | 30 days supply | Qty: 60 | Fill #0

## 2017-03-03 MED FILL — SPIRONOLACTONE 25 MG TABLET: 25 | 30 days supply | Qty: 30 | Fill #0

## 2017-03-03 NOTE — Plan of Care (Signed)
Problem: Skin Integrity: Goal: Risk for impaired skin integrity will decrease Outcome: Progressing Pt has no new signs of skin break down this shift. Pt is able to turn himself in bed and ambulate independently. Will continue to monitor and encourage ambulation.

## 2017-03-03 NOTE — Discharge Summary (Signed)
Advanced Heart Failure Team  Discharge Summary   Patient ID: Corey Carson MRN: [id number], DOB/AGE: Nov 28, 1956 61 y.o. Admit date: 02/20/2017 D/C date:     03/03/2017   Primary Discharge Diagnoses:  1. A flutter:  NSR at discharge. On Eliquis 5 mg twice a day 2. Acute Systolic Heart Failure: ECHO EF 3./2018 EF 20%.   3. Influenza B 4. CAD 5. NSVT  Hospital Course:  61 yo admitted with dyspnea and lower extremity edema, found to be in atrial flutter.  He converted back to NSR spontaneously. Found to have biventricular failure by echo, LHC/RHC with nonobstructive CAD, primarily RV failure, and low output. Placed on short term milrinone and diuresed with IV lasix. As he improved milrinone was stopped and he transitioned to po lasix. Converted to NSR on amio. Amio stopped and he will continue apixaban. He will continue to be followed closely in the HF clinic and has follow up next week. Plan to check BMET and EKG at that time.    1. Atrial flutter: Of uncertain duration.  Placed on amio drip. Chemically converted to NSR so amio later stopped. Continue apixaban.  - Would consider eventual EP evaluation for flutter ablation, though flutter appears somewhat atypical. 2. Acute systolic CHF: Biventricular failure, echo with EF 20%, D-shaped septum, moderately dilated/severely dysfunctional RV.  TSH, HIV normal.  Does not drink ETOH.  Mother with CHF.  Nonobstructive CAD on cath.  RHC with restrictive hemodynamics and prominent RV failure.  Placed on short term milrinone and later weaned off once diuresed. CO-OX stable off milrinone--> 62%.  Renal function stable.  - Diuresed with IV lasix and transitioned to po lasix 40 mg daily. Overall diuresed 27 pountds.  - No bb with low output.Continue Entresto 24/26 bid.   - Continue digoxin, level <0.2    - Continue hydralazine 25 mg tid, continue Imdur.  - Continue spironolactone to 25 mg daily.  - Narrow QRS, would not be good CRT candidate.  3.  Febrile illness: Tested + for Influenza B.  No PNA on CXR.  Blood cultures negative. Completed  oseltamivir.  3. CAD: Nonobstructive.   - Continue statin. No ASA with Eliquis.  4. NSVT: Occasional PVCs, no NSVT   RHC/LHC 02/22/2017 RA 21 PCWP 19 CI/CO 1.2/2.92   1st Mrg lesion, 60 %stenosed.  Prox LAD to Mid LAD lesion, 40 %stenosed.  There is severe left ventricular systolic dysfunction.  LV end diastolic pressure is moderately elevated.  The left ventricular ejection fraction is less than 25% by visual estimate.  ECHO 02/22/2017  Normal LV size with mild LV hypertrophy. EF 20%, diffuse   hypokinesis. D-shaped interventricular septum suggestive of RV   pressure/volume overload. Moderately dilated RV with severely   decreased systolic function. Moderate TR.   Discharge Weight: 227 pounds.   Discharge Vitals: Blood pressure 101/72, pulse 67, temperature 98.1 F (36.7 C), resp. rate 18, height 5\' 7"  (1.702 m), weight 227 lb 1.6 oz (103 kg), SpO2 97 %.  Labs: Lab Results  Component Value Date   WBC 5.6 03/03/2017   HGB 15.8 03/03/2017   HCT 46.1 03/03/2017   MCV 82.9 03/03/2017   PLT 181 03/03/2017    Recent Labs Lab 03/03/17 0430  NA 135  K 4.6  CL 105  CO2 21*  BUN 17  CREATININE 1.06  CALCIUM 8.0*  GLUCOSE 115*   Lab Results  Component Value Date   CHOL 175 02/21/2017   HDL 35 (L) 02/21/2017   [place]  118 (H) 02/21/2017   TRIG 108 02/21/2017   BNP (last 3 results)  Recent Labs  02/21/17 0355  BNP 812.3*    ProBNP (last 3 results) No results for input(s): PROBNP in the last 8760 hours.   Diagnostic Studies/Procedures   No results found.  Discharge Medications   Allergies as of 03/03/2017      Reactions   Sulfa Antibiotics Anaphylaxis      Medication List    TAKE these medications   apixaban 5 MG Tabs tablet Commonly known as:  ELIQUIS Take 1 tablet (5 mg total) by mouth 2 (two) times daily.   atorvastatin 20 MG tablet Commonly  known as:  LIPITOR Take 1 tablet (20 mg total) by mouth daily at 6 PM.   digoxin 0.125 MG tablet Commonly known as:  LANOXIN Take 1 tablet (0.125 mg total) by mouth daily.   furosemide 40 MG tablet Commonly known as:  LASIX Take 1 tablet (40 mg total) by mouth daily.   hydrALAZINE 25 MG tablet Commonly known as:  APRESOLINE Take 1 tablet (25 mg total) by mouth every 8 (eight) hours.   isosorbide mononitrate 30 MG 24 hr tablet Commonly known as:  IMDUR Take 1 tablet (30 mg total) by mouth daily.   sacubitril-valsartan 24-26 MG Commonly known as:  ENTRESTO Take 1 tablet by mouth 2 (two) times daily.   spironolactone 25 MG tablet Commonly known as:  ALDACTONE Take 1 tablet (25 mg total) by mouth daily.       Disposition   The patient will be discharged in stable condition to home. Discharge Instructions    (HEART FAILURE PATIENTS) Call MD:  Anytime you have any of the following symptoms: 1) 3 pound weight gain in 24 hours or 5 pounds in 1 week 2) shortness of breath, with or without a dry hacking cough 3) swelling in the hands, feet or stomach 4) if you have to sleep on extra pillows at night in order to breathe.    Complete by:  As directed    Amb Referral to Cardiac Rehabilitation    Complete by:  As directed    Diagnosis:  Heart Failure (see criteria below if ordering Phase II)   Heart Failure Type:  Chronic Systolic & Diastolic   Diet - low sodium heart healthy    Complete by:  As directed    Increase activity slowly    Complete by:  As directed    PICC line removal    Complete by:  As directed      Follow-up Information    Loralie Champagne, MD Follow up on 03/10/2017.   Specialty:  Cardiology Why:  at 1200 . Garage Code [id number] Contact information: [place] [address] [phone]             Duration of Discharge Encounter: Greater than 35 minutes   Signed, Amy Clegg NP-C  03/03/2017, 7:37 AM

## 2017-03-03 NOTE — Progress Notes (Signed)
I reviewed discharge instructions with patient. He is in stable condition. I also reviewed d/c instructions with Felecia. No further questions and pt d/cd to private vehicle.

## 2017-03-03 NOTE — Progress Notes (Signed)
Patient ID: Corey Carson, male   DOB: [birth year], 61 y.o.   MRN: [id number]   SUBJECTIVE:  Influenza B positive, now on oseltamivir.  Blood cultures negative. Now afebrile.   Yesterday milrinone stopped and spiro increased to 25 mg daily. Marland Kitchen Co-ox today is 62%, CVP 6   Wants to go home. Denies SOB.    Echo: EF 20%, D-shaped septum with moderate RV dilation/severely decreased RV systolic function.   RHC/LHC:  - Coronary angio with 60% OM1, 40% pLAD RA mean 21 PA 30/11 PCWP mean 19 CI 1.3  Scheduled Meds: . amiodarone  400 mg Oral BID  . apixaban  5 mg Oral BID  . atorvastatin  20 mg Oral q1800  . digoxin  0.125 mg Oral Daily  . furosemide  40 mg Oral Daily  . hydrALAZINE  25 mg Oral Q8H  . insulin aspart  0-15 Units Subcutaneous TID WC  . isosorbide mononitrate  30 mg Oral Daily  . oseltamivir  75 mg Oral BID  . polyethylene glycol  17 g Oral Daily  . sacubitril-valsartan  1 tablet Oral BID  . sodium chloride flush  10-40 mL Intracatheter Q12H  . spironolactone  25 mg Oral Daily   Continuous Infusions: . sodium chloride 100 mL/hr (02/21/17 2000)   PRN Meds:.acetaminophen, alum & mag hydroxide-simeth, bisacodyl, guaiFENesin-dextromethorphan, loperamide, ondansetron (ZOFRAN) IV, sodium chloride flush    Vitals:   03/02/17 1950 03/03/17 0011 03/03/17 0519 03/03/17 0552  BP: 113/79 103/62  101/72  Pulse: 66 70  67  Resp: 20 (!) 21 19 18   Temp: 97.7 F (36.5 C) 97.3 F (36.3 C)  98.1 F (36.7 C)  TempSrc: Oral Oral    SpO2: 98% 98%  97%  Weight:    227 lb 1.6 oz (103 kg)  Height:        Intake/Output Summary (Last 24 hours) at 03/03/17 0721 Last data filed at 03/02/17 2132  Gross per 24 hour  Intake              120 ml  Output              950 ml  Net             -830 ml    LABS: Basic Metabolic Panel:  Recent Labs  03/02/17 0500 03/03/17 0430  NA 136 135  K 4.4 4.6  CL 104 105  CO2 23 21*  GLUCOSE 133* 115*  BUN 19 17  CREATININE 1.06 1.06    CALCIUM 8.0* 8.0*  MG 1.9 1.9   Liver Function Tests: No results for input(s): AST, ALT, ALKPHOS, BILITOT, PROT, ALBUMIN in the last 72 hours. No results for input(s): LIPASE, AMYLASE in the last 72 hours. CBC:  Recent Labs  03/02/17 0500 03/03/17 0430  WBC 4.5 5.6  HGB 15.7 15.8  HCT 47.2 46.1  MCV 83.2 82.9  PLT 203 181   Cardiac Enzymes: No results for input(s): CKTOTAL, CKMB, CKMBINDEX, TROPONINI in the last 72 hours. BNP: Invalid input(s): POCBNP D-Dimer: No results for input(s): DDIMER in the last 72 hours. Hemoglobin A1C: No results for input(s): HGBA1C in the last 72 hours. Fasting Lipid Panel: No results for input(s): CHOL, HDL, LDLCALC, TRIG, CHOLHDL, LDLDIRECT in the last 72 hours. Thyroid Function Tests: No results for input(s): TSH, T4TOTAL, T3FREE, THYROIDAB in the last 72 hours.  Invalid input(s): FREET3 Anemia Panel: No results for input(s): VITAMINB12, FOLATE, FERRITIN, TIBC, IRON, RETICCTPCT in the last 72 hours.  RADIOLOGY:  Dg Chest 2 View  Result Date: 02/21/2017 CLINICAL DATA:  Right side chest pain. EXAM: CHEST  2 VIEW COMPARISON:  05/19/2013 FINDINGS: Cardiomegaly. Lungs are clear. No effusions. No acute bony abnormality. IMPRESSION: Cardiomegaly.  No active disease. Electronically Signed   By: Rolm Baptise M.D.   On: 02/21/2017 08:25   Dg Chest Port 1 View  Result Date: 02/26/2017 CLINICAL DATA:  admitted on 02/20/2017 with pneumonia. EXAM: PORTABLE CHEST 1 VIEW COMPARISON:  02/21/2017 FINDINGS: Patient has right-sided PICC line, tip overlying the level of superior vena cava. Shallow lung inflation. There is enlargement of the cardiac silhouette. There are no focal consolidations or pleural effusions. No pulmonary edema. IMPRESSION: 1. Interval placement of right-sided PICC line. 2. Increased prominence of cardiopericardial silhouette. No evidence for pericardial effusion on echocardiogram performed earlier. 3. No pulmonary edema. Electronically  Signed   By: Nolon Nations M.D.   On: 02/26/2017 15:34   Ct Head Code Stroke W/o Cm  Result Date: 02/20/2017 CLINICAL DATA:  Code stroke. Initial evaluation for acute left arm numbness. EXAM: CT HEAD WITHOUT CONTRAST TECHNIQUE: Contiguous axial images were obtained from the base of the skull through the vertex without intravenous contrast. COMPARISON:  Prior CT from 08/27/2014. FINDINGS: Brain: Generalized cerebral atrophy with mild chronic small vessel ischemic disease. No acute intracranial hemorrhage. No evidence for acute large vessel territory infarct. No mass lesion, midline shift or mass effect. No hydrocephalus. No extra-axial fluid collection. Vascular: No hyperdense vessel. Scattered vascular calcifications noted within the carotid siphons. Skull: Scalp soft tissues demonstrate no acute abnormality. Calvarium intact. Sinuses/Orbits: Globes and orbital soft tissues within normal limits. Paranasal sinuses and mastoid air cells are clear. Other: None. ASPECTS [facility] Stroke Program Early CT Score) - Ganglionic level infarction (caudate, lentiform nuclei, internal capsule, insula, M1-M3 cortex): 7 - Supraganglionic infarction (M4-M6 cortex): 3 Total score (0-10 with 10 being normal): 10 IMPRESSION: 1. No acute intracranial infarct or other process identified. 2. ASPECTS is 10 3. Generalized age-related cerebral atrophy with mild chronic small vessel ischemic disease, progressed from 2015. Critical Value/emergent results were called by telephone at the time of interpretation on 02/20/2017 at 10:51 pm to Dr. Nanda Quinton , who verbally acknowledged these results. Electronically Signed   By: Jeannine Boga M.D.   On: 02/20/2017 22:55    PHYSICAL EXAM CVP 6 General: NAD. Sitting on the side of the bed.  HEENT: normal  Neck: JVP 6-7 cm.  No thyromegaly or thyroid nodule.  Lungs: CTAB. On room air.  CV: Laterally displaced PMI.  Regular S1S2, no S3S4, 1/6 HSM LLSB.  No edema.  Abdomen: Obese,  soft, nontender, no hepatosplenomegaly, nondistended Neurologic: Alert and oriented x 3.  Moves all 4 without problem Psych: Normal affect. Fatigued Extremities: No clubbing or cyanosis. RUE PICC  TELEMETRY: NSR with occasional PVCs. 70-80s  Personally reviewed  ASSESSMENT AND PLAN: 61 yo admitted with dyspnea and lower extremity edema, found to be in atrial flutter.  He converted back to NSR spontaneously. Found to have biventricular failure by echo, LHC/RHC with nonobstructive CAD, primarily RV failure, and low output.  1. Atrial flutter: Of uncertain duration.  Remains in NSR. Stop amio - Continue apixaban.  - Would consider eventual EP evaluation for flutter ablation, though flutter appears somewhat atypical. 2. Acute systolic CHF: Biventricular failure, echo with EF 20%, D-shaped septum, moderately dilated/severely dysfunctional RV.  TSH, HIV normal.  Does not drink ETOH.  Mother with CHF.  Nonobstructive CAD on cath.  RHC with restrictive  hemodynamics and prominent RV failure.   CO-OX stable off milrinone--> 62%.  Renal function stable.  - He can start Lasix 40 mg po daily for home.  Overall diuresed 27 pountds.  - No bb with low output. - Continue Entresto 24/26 bid.   - Continue digoxin, level <0.2    - Continue hydralazine 25 mg tid, continue Imdur.  - Continue spironolactone to 25 mg daily.  - Narrow QRS, would not be good CRT candidate.  3. Febrile illness: Influenza B.  No PNA on CXR.  Blood cultures negative so far, antibiotics have been stopped.  WBCs not elevated.  Afebrile.      - Continue oseltamivir.  3. CAD: Nonobstructive.   - Continue statin. No ASA with Eliquis.  4. NSVT: Occasional PVCs, no NSVT.   Home today. Follow up in HF clinic next week.   Amy Clegg   NP-C  03/03/2017 7:21 AM   Patient seen with NP, agree with the above note.  Good co-ox at 62% off milrinone, CVP 6.  No lightheadedness, no dyspnea.  He is ready to go home today.  Would send out on current  regimen.  He will need close followup with me in CHF clinic.   Loralie Champagne 03/03/2017 8:18 AM

## 2017-03-08 ENCOUNTER — Telehealth (HOSPITAL_COMMUNITY): Payer: Self-pay | Admitting: Adult Health

## 2017-03-08 NOTE — Telephone Encounter (Signed)
Sherriff called office today.   Corey Carson was found unresponsive today. EMS on the scene. He later declared deceased.   Amy Clegg  NP-C  11:43 AM

## 2017-03-10 ENCOUNTER — Inpatient Hospital Stay (HOSPITAL_COMMUNITY): Payer: BLUE CROSS/BLUE SHIELD

## 2017-04-04 DEATH — deceased

## 2018-11-24 IMAGING — CT CT HEAD CODE STROKE
3 series · 14 of 47 positions shown, 16 images · non-contrast
Comparison: Prior CT from 08/27/2014.

CLINICAL DATA: Code stroke. Initial evaluation for acute left arm
numbness.

EXAM:
CT HEAD WITHOUT CONTRAST
TECHNIQUE: Contiguous axial images were obtained from the base of the skull
through the vertex without intravenous contrast.

[Series 2: head code stroke wo · axial · 0.47mm/px · z∈[+1339,+1474]mm · 8 of 33 slices shown, 10 images]
[im 3/33  brain]
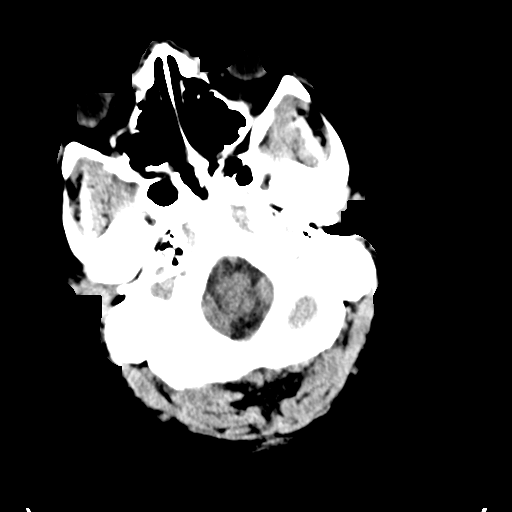
[im 3/33  bone]
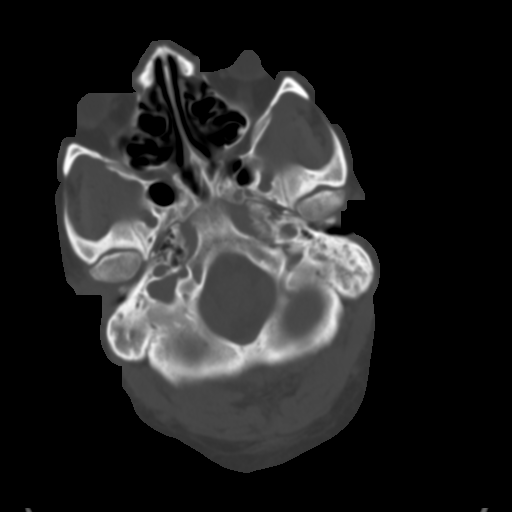
[im 7/33  brain]
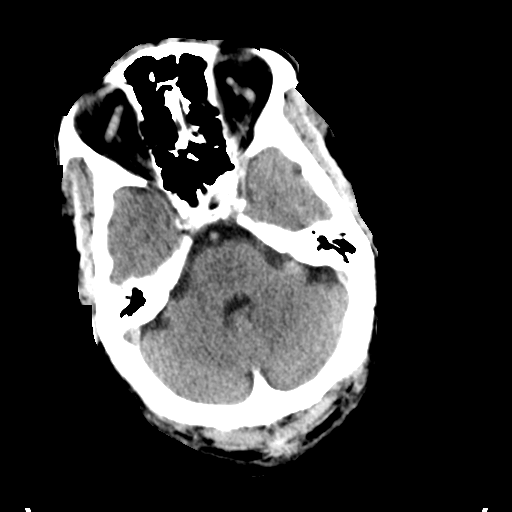
[im 10/33  brain]
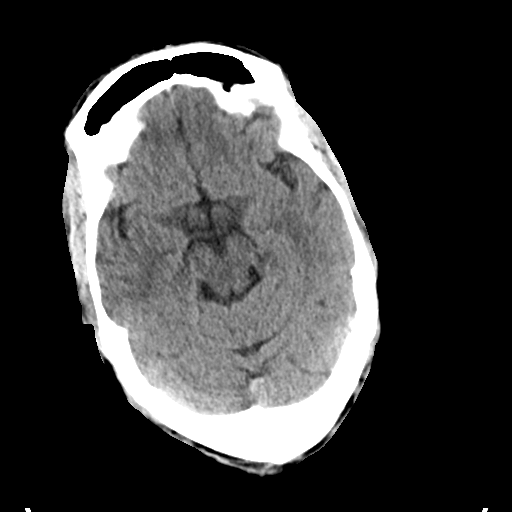
[im 15/33  brain]
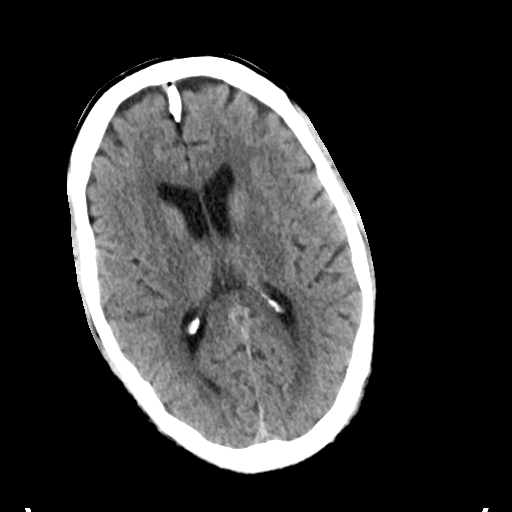
[im 18/33  brain]
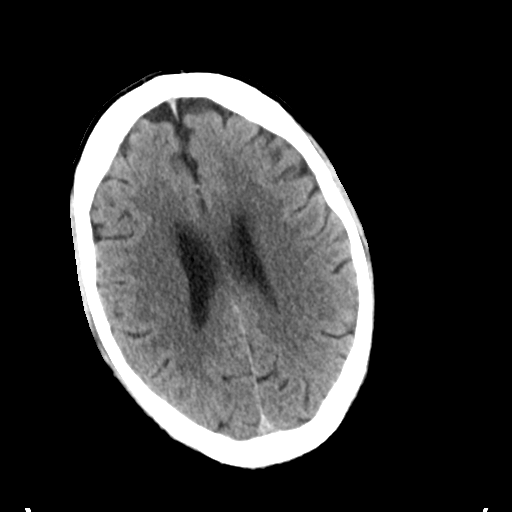
[im 18/33  bone]
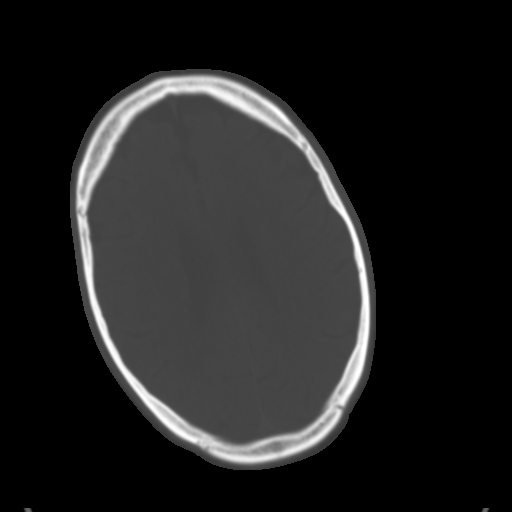
[im 23/33  brain]
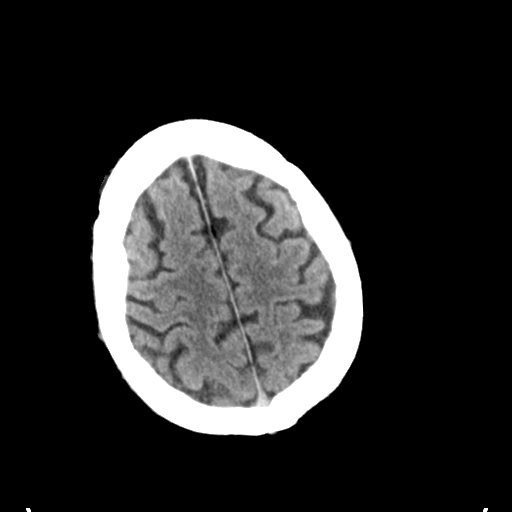
[im 26/33  brain]
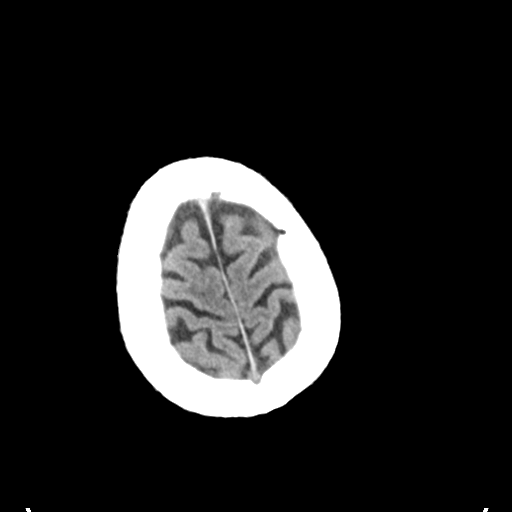
[im 30/33  brain]
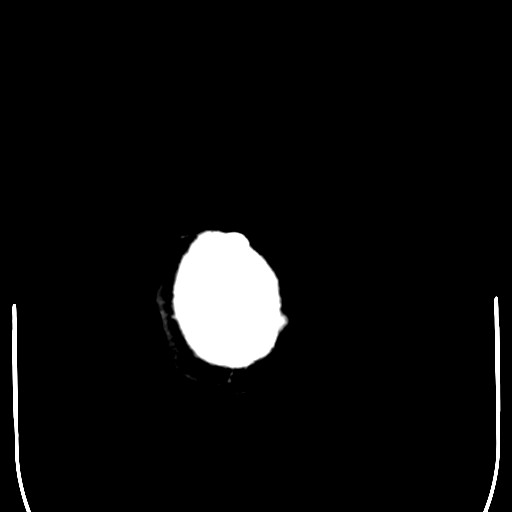

[Series 4: coronal soft tissue · coronal · 0.33mm/px · 3 of 74 slices shown]
[im 25/74  brain]
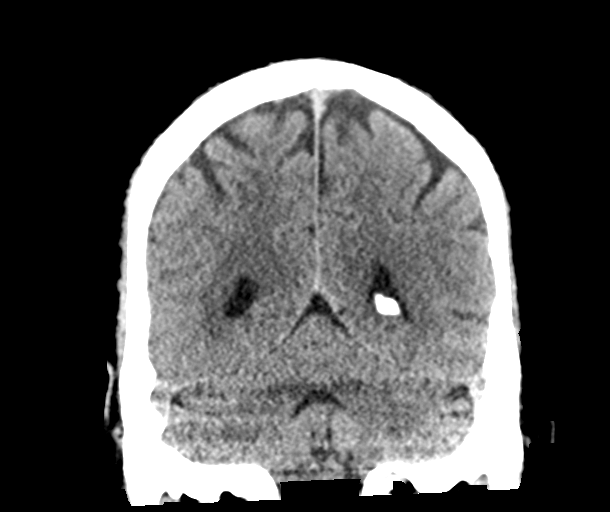
[im 33/74  brain]
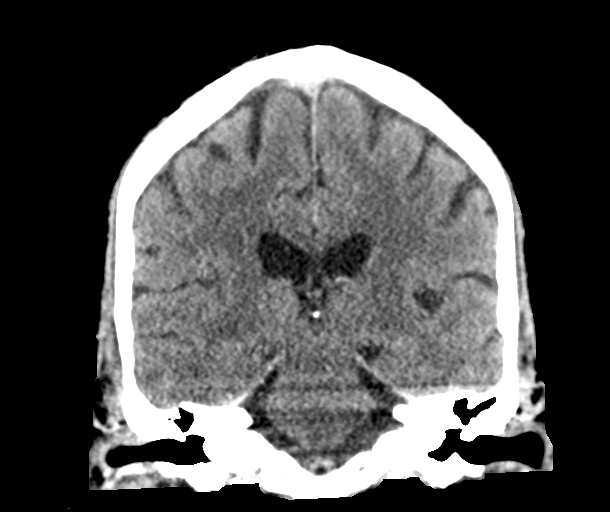
[im 41/74  brain]
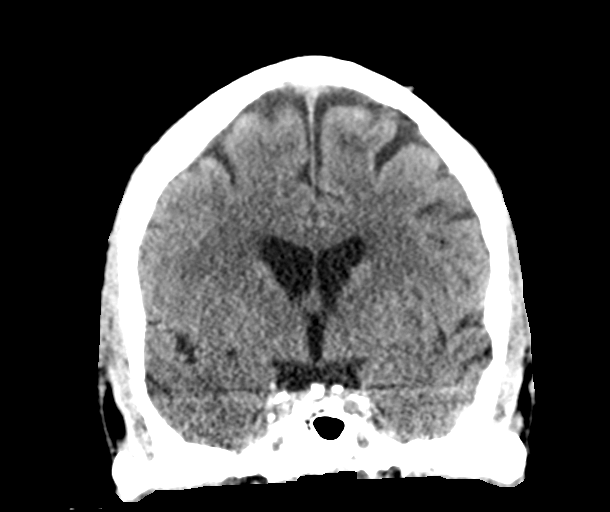

[Series 5: sagittal soft tissue · sagittal · 0.32mm/px · 3 of 54 slices shown]
[im 18/54  brain]
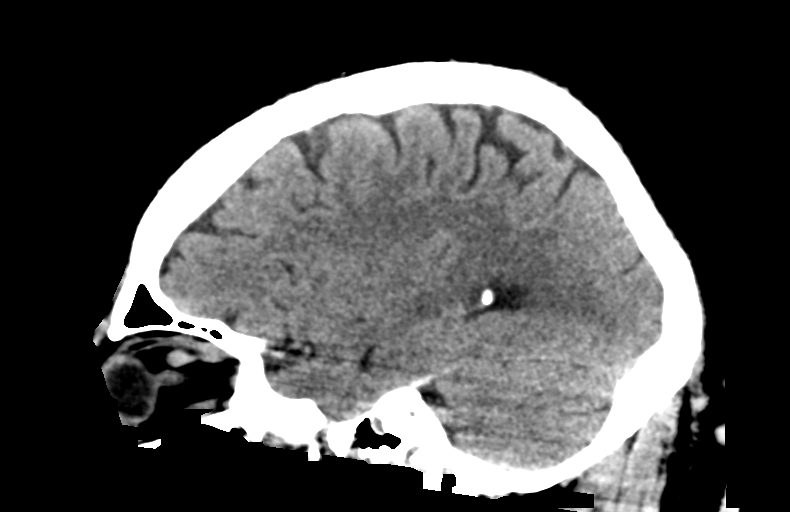
[im 27/54  brain]
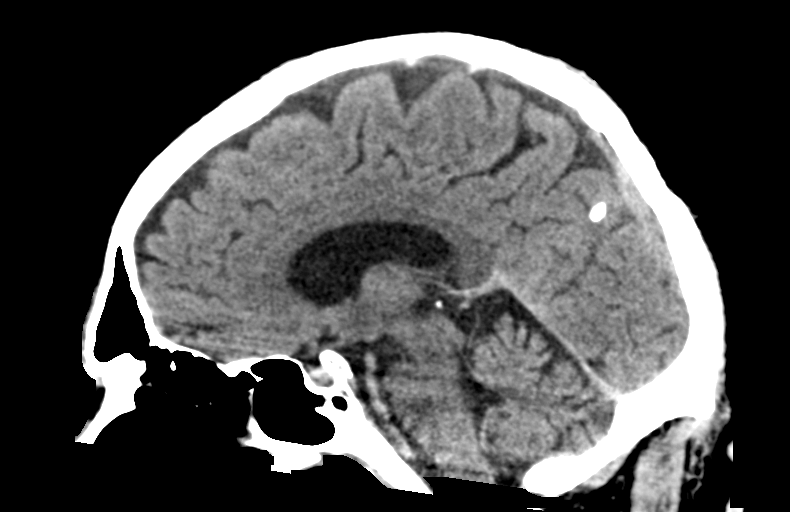
[im 36/54  brain]
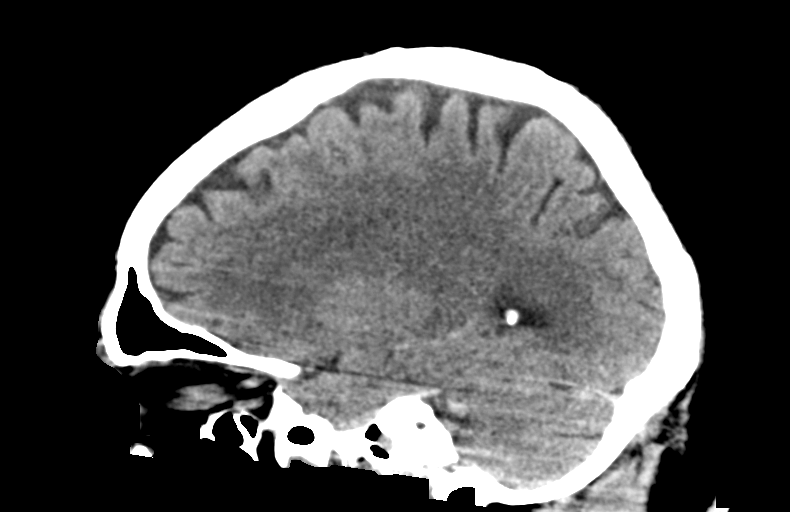

[14 of 47 positions shown; findings below may reference images not displayed]

FINDINGS: Brain: Generalized cerebral atrophy with mild chronic small vessel
ischemic disease. No acute intracranial hemorrhage. No evidence for
acute large vessel territory infarct. No mass lesion, midline shift
or mass effect. No hydrocephalus. No extra-axial fluid collection.

Vascular: No hyperdense vessel. Scattered vascular calcifications
noted within the carotid siphons.

Skull: Scalp soft tissues demonstrate no acute abnormality.
Calvarium intact.

Sinuses/Orbits: Globes and orbital soft tissues within normal
limits. Paranasal sinuses and mastoid air cells are clear.

Other: None.

ASPECTS (Alberta Stroke Program Early CT Score)

- Ganglionic level infarction (caudate, lentiform nuclei, internal
capsule, insula, M1-M3 cortex): 7

- Supraganglionic infarction (M4-M6 cortex): 3

Total score (0-10 with 10 being normal): 10
IMPRESSION: 1. No acute intracranial infarct or other process identified.
2. ASPECTS is 10
3. Generalized age-related cerebral atrophy with mild chronic small
vessel ischemic disease, progressed from 7211.

Critical Value/emergent results were called by telephone at the time
of interpretation on 02/20/2017 at [DATE] to Dr. DARDANELI PEJAKOVIC , who
verbally acknowledged these results.

## 2018-11-30 IMAGING — CR DG CHEST 1V PORT
1 series · 1 of 1 positions shown · non-contrast
Comparison: 02/21/2017

CLINICAL DATA: admitted on 02/20/2017 with pneumonia.

EXAM:
PORTABLE CHEST 1 VIEW

[AP]
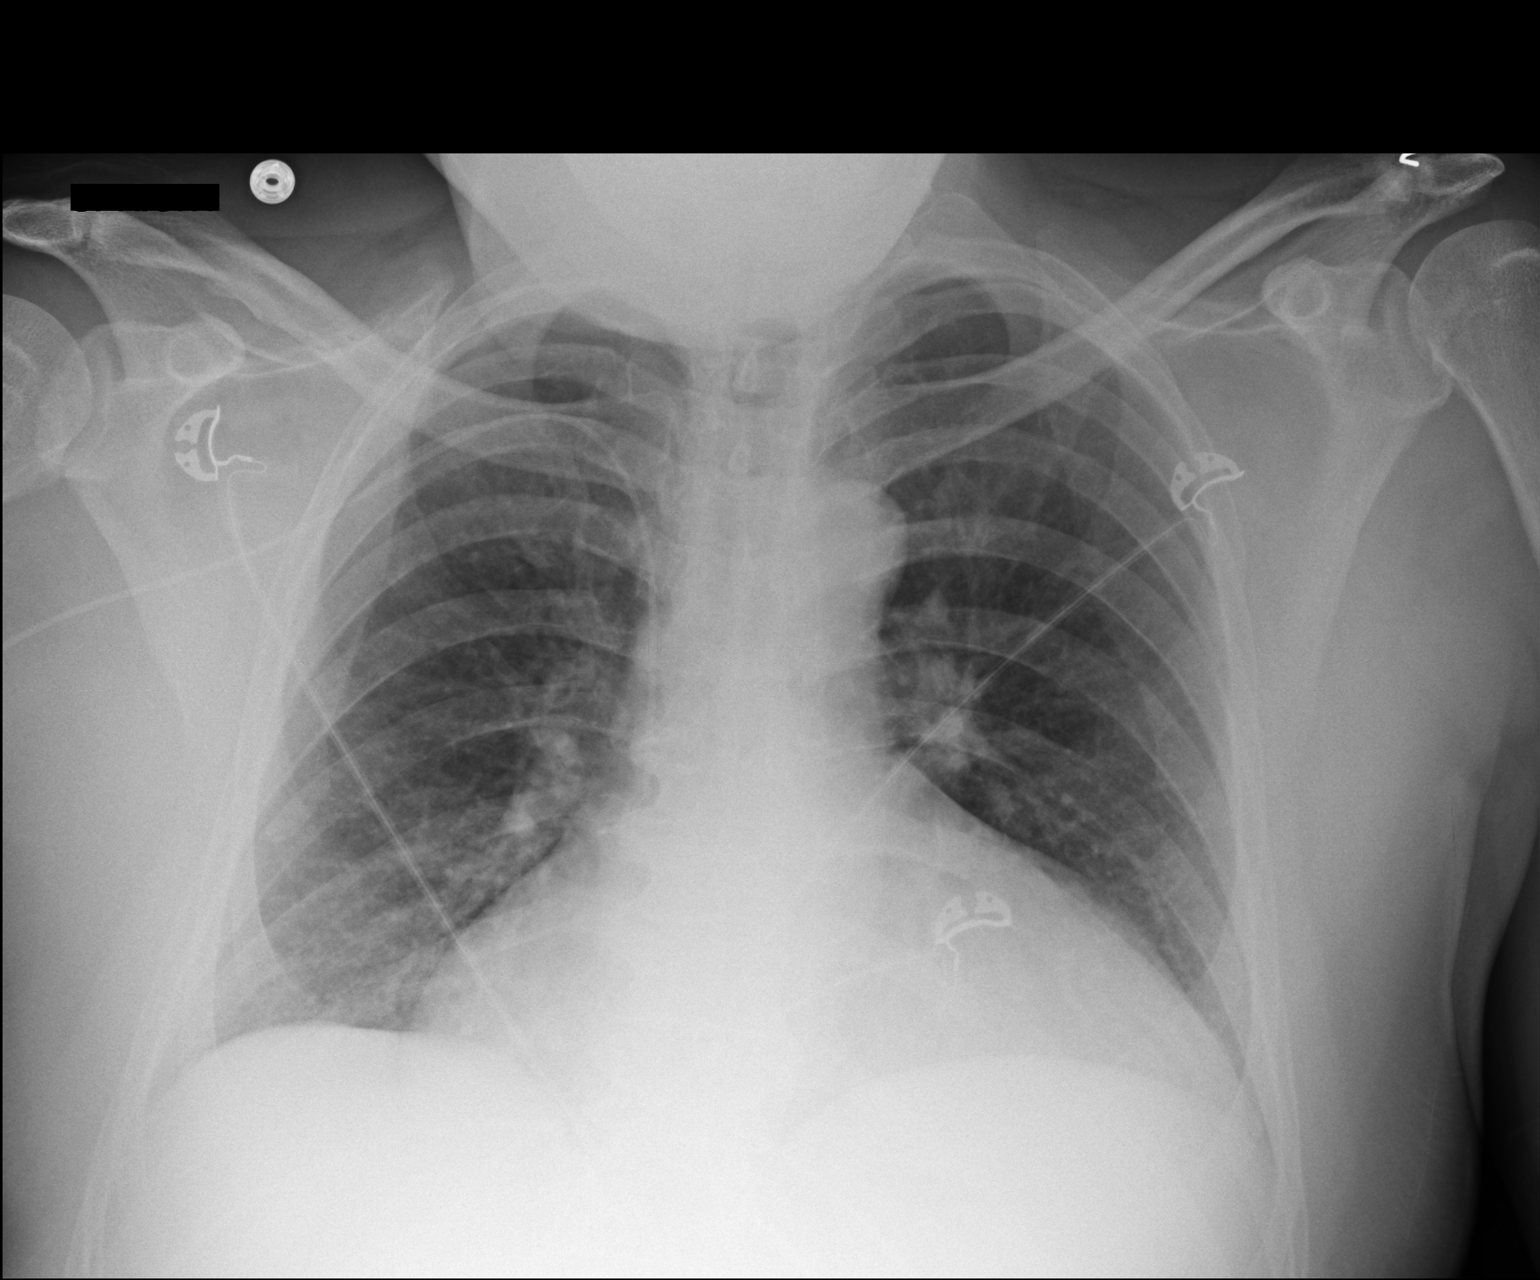

[1 of 1 positions shown; findings below may reference images not displayed]

FINDINGS: Patient has right-sided PICC line, tip overlying the level of
superior vena cava. Shallow lung inflation.

There is enlargement of the cardiac silhouette. There are no focal
consolidations or pleural effusions. No pulmonary edema.
IMPRESSION: 1. Interval placement of right-sided PICC line.
2. Increased prominence of cardiopericardial silhouette. No evidence
for pericardial effusion on echocardiogram performed earlier.
3. No pulmonary edema.
